# Patient Record
Sex: Male | Born: 1990
Health system: Southern US, Community
[De-identification: ages and names within clinical notes are randomized; demographics above are authoritative.]

## PROBLEM LIST (undated history)

## (undated) DIAGNOSIS — I10 Essential (primary) hypertension: Secondary | ICD-10-CM

## (undated) DIAGNOSIS — F840 Autistic disorder: Secondary | ICD-10-CM

## (undated) DIAGNOSIS — E119 Type 2 diabetes mellitus without complications: Secondary | ICD-10-CM

## (undated) DIAGNOSIS — F319 Bipolar disorder, unspecified: Secondary | ICD-10-CM

## (undated) HISTORY — PX: NO PAST SURGERIES: SHX2092

---
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
## 2012-07-03 IMAGING — CT CT ORBITS W/O CM
3 of 6 series · 15 of 47 positions shown, 18 images · non-contrast
Comparison: None.

CT HEAD

CLINICAL DATA: Blunt trauma.  Pain.  Laceration above the right
eye.

CT HEAD AND ORBITS WITHOUT CONTRAST
TECHNIQUE: Contiguous axial images were obtained from the base of
the skull through the vertex without contrast. Multidetector CT
imaging of the orbits was performed using the standard protocol
without intravenous contrast.

[Series 2: peds brain wo · axial · 0.51mm/px · z∈[+78,+208]mm · 9 of 66 slices shown, 12 images]
[im 7/66  brain]
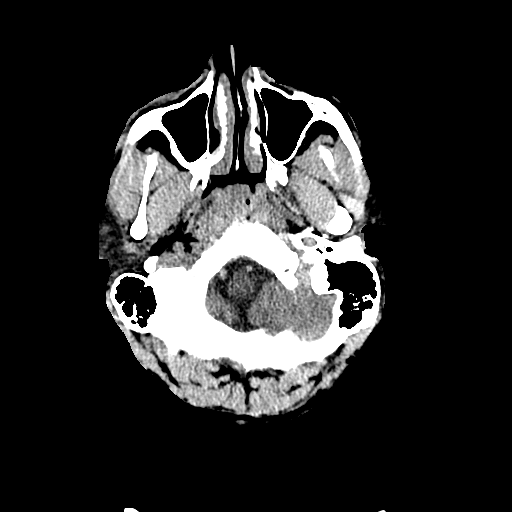
[im 7/66  bone]
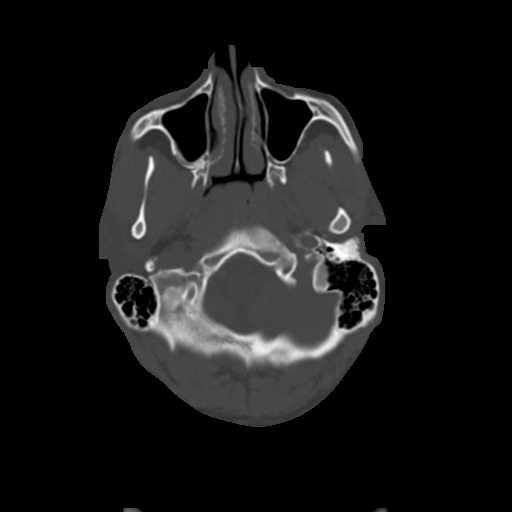
[im 14/66  bone]
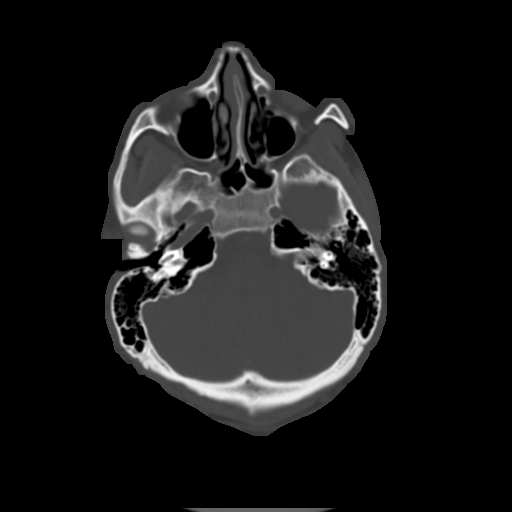
[im 20/66  bone]
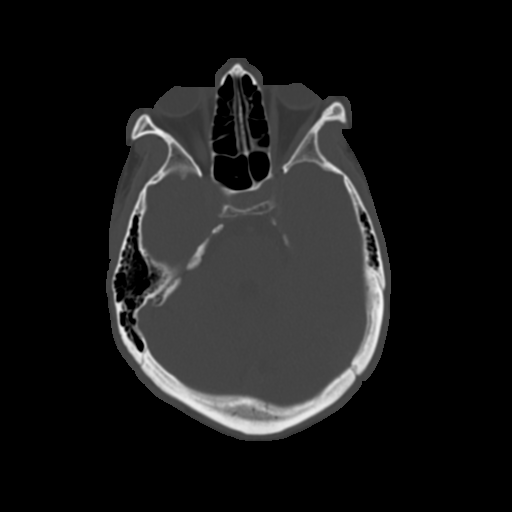
[im 27/66  bone]
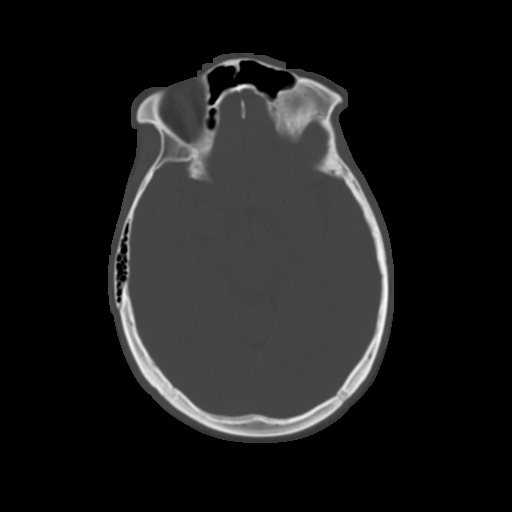
[im 33/66  brain]
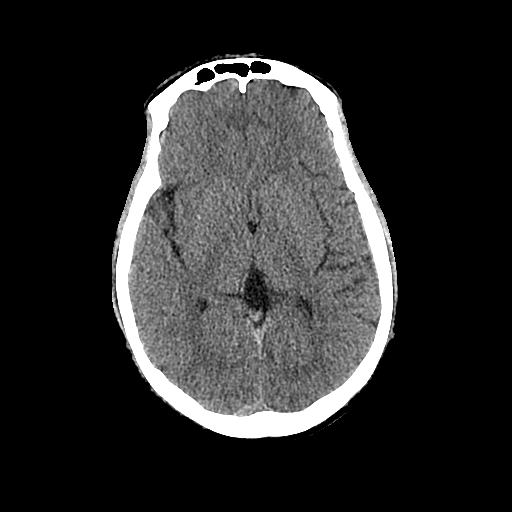
[im 33/66  bone]
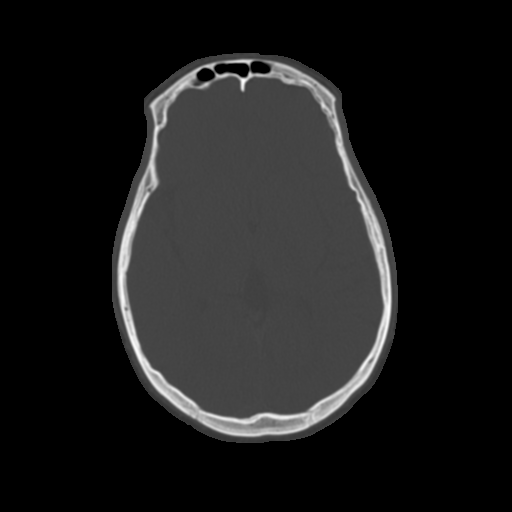
[im 40/66  bone]
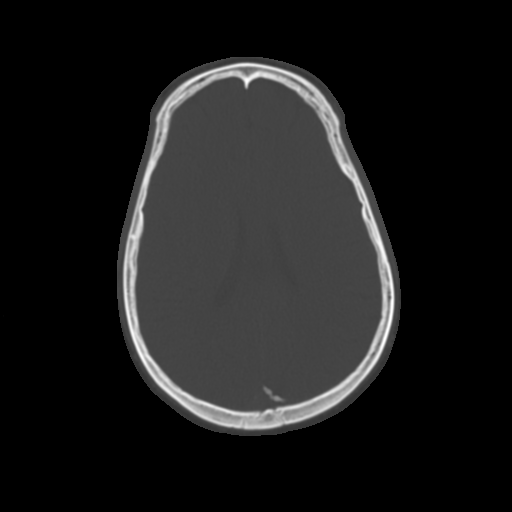
[im 46/66  bone]
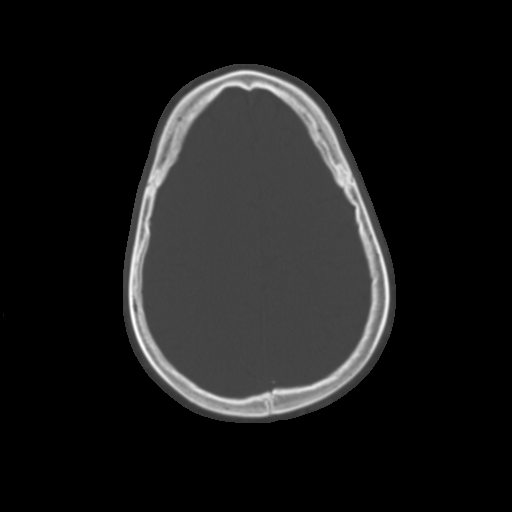
[im 53/66  bone]
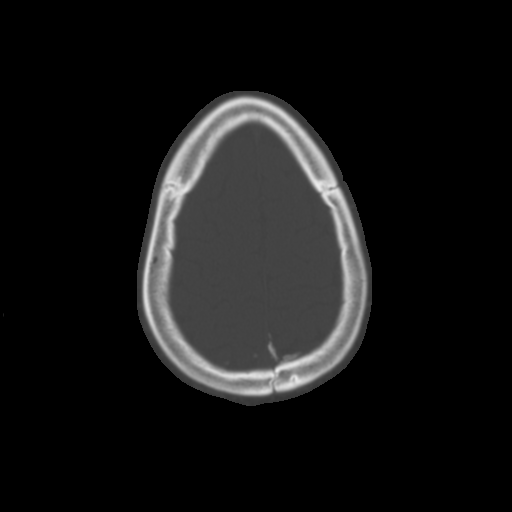
[im 59/66  brain]
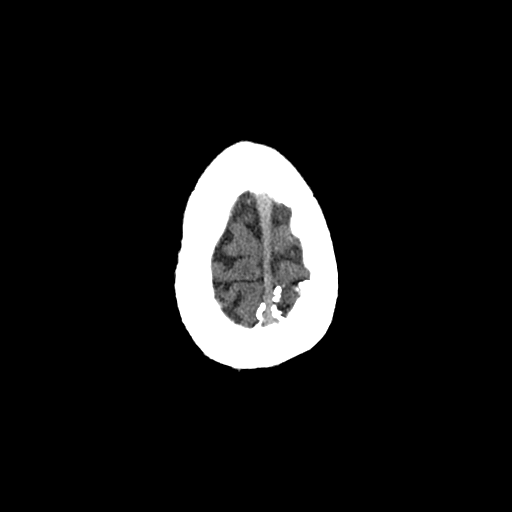
[im 59/66  bone]
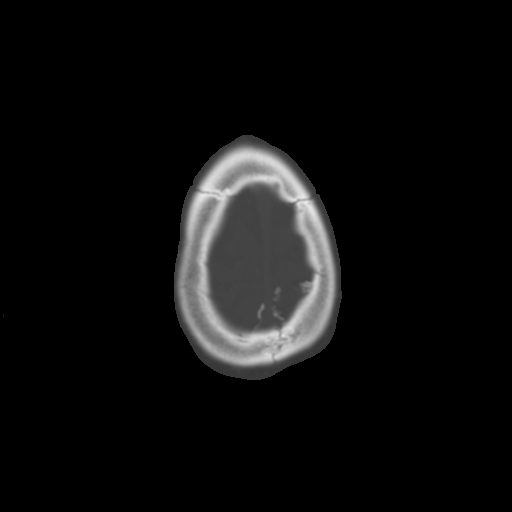

[mpr, coronal std, coronal · coronal · 0.36mm/px · 3 of 62 slices shown]
[im 21/62  bone]
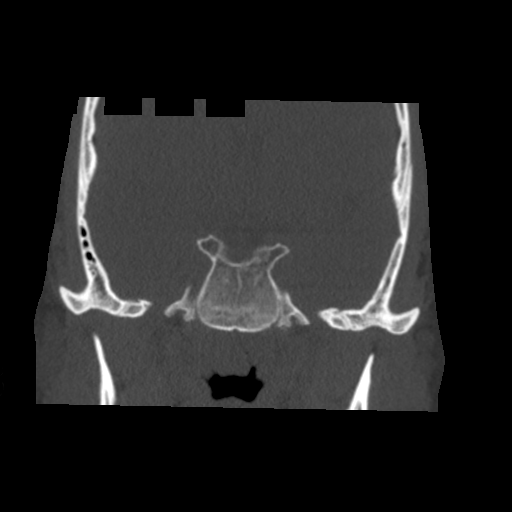
[im 28/62  bone]
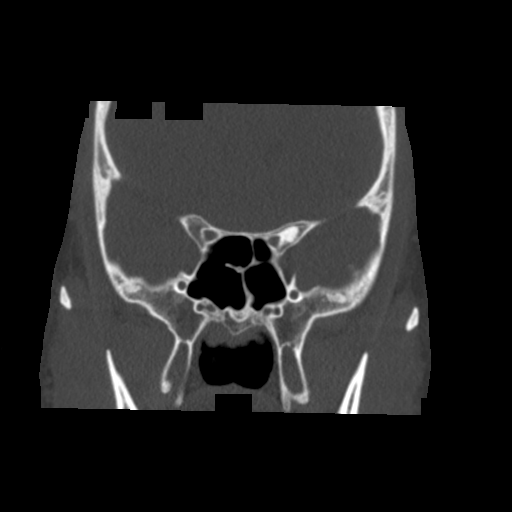
[im 34/62  bone]
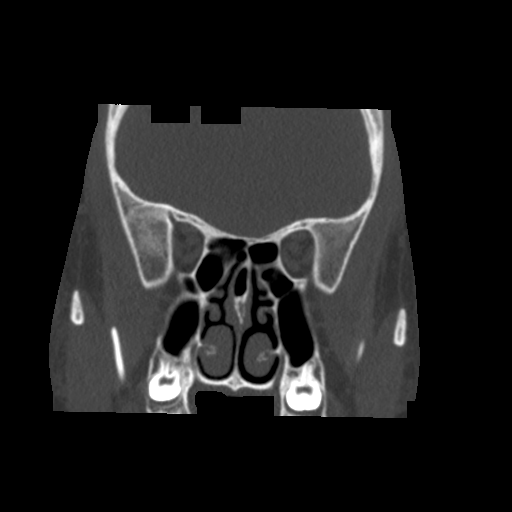

[mpr, sagittal bone, sagittal · sagittal · 0.36mm/px · 3 of 86 slices shown]
[im 2/86  bone]
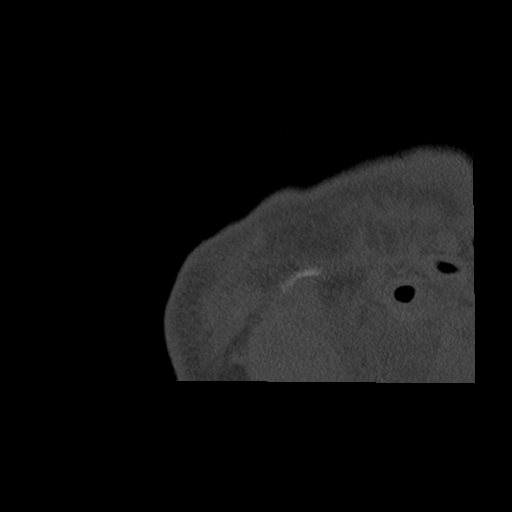
[im 29/86  bone]
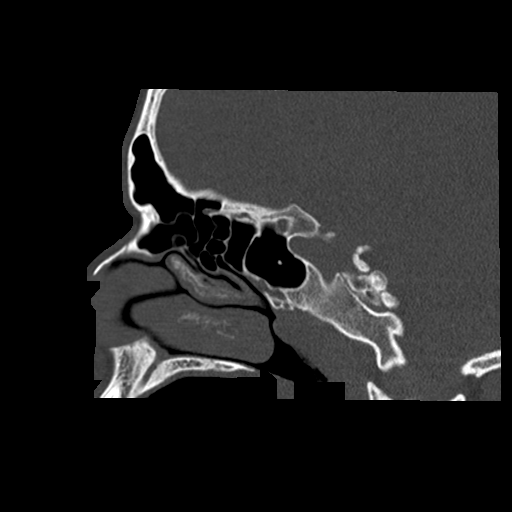
[im 57/86  bone]
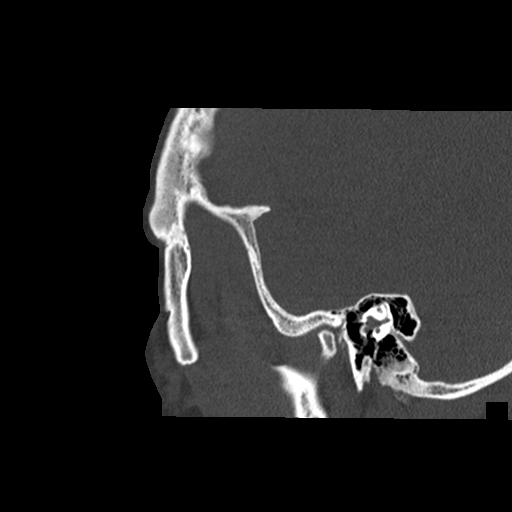

[15 of 47 positions shown; findings below may reference images not displayed]

FINDINGS: The brain appears normal without evidence of acute
infarction, hemorrhage, mass lesion, mass effect, midline shift or
abnormal extra-axial fluid collection.  There is no pneumocephalus
or hydrocephalus.  Calvarium is intact.
IMPRESSION: Negative head CT.

CT ORBITS
FINDINGS: The globes are intact and the lenses are located.  There
is some soft tissue contusion about the right eye.  No fracture.
Imaged paranasal sinuses and mastoid air cells are clear.
IMPRESSION: Soft tissue contusion about the right eye.  No fracture or other
acute abnormality.

## 2012-10-02 ENCOUNTER — Emergency Department (INDEPENDENT_AMBULATORY_CARE_PROVIDER_SITE_OTHER)
Admission: EM | Admit: 2012-10-02 | Discharge: 2012-10-02 | Disposition: A | Discharge status: Home / Self Care | Payer: Medicaid Other | Source: Home / Self Care | Attending: Family Medicine | Admitting: Family Medicine

## 2012-10-02 ENCOUNTER — Encounter (HOSPITAL_COMMUNITY): Payer: Self-pay | Admitting: Emergency Medicine

## 2012-10-02 DIAGNOSIS — Z760 Encounter for issue of repeat prescription: Secondary | ICD-10-CM

## 2012-10-02 DIAGNOSIS — J310 Chronic rhinitis: Secondary | ICD-10-CM

## 2012-10-02 MED ORDER — FLUTICASONE PROPIONATE 50 MCG/ACT NA SUSP: 2.0000 | Freq: Every day | NASAL | Status: DC

## 2012-10-02 NOTE — ED Notes (Signed)
Requested med refill on flonase.  Patient has been a patient at [facility].

## 2012-10-02 NOTE — ED Provider Notes (Signed)
History     CSN: [id number]  Arrival date & time 10/02/12  0941   First MD Initiated Contact with Patient 10/02/12 1011      Chief Complaint  Patient presents with  . Medication Refill    (Consider location/radiation/quality/duration/timing/severity/associated sxs/prior treatment) HPI Comments: 21 year old male with history of autism, developmental delay and seasonal allergies and chronic rhinitis. Here with his guardian requesting refills on his chronic nasal steroid. He was a [facility] patient that does not currently have a primary care provider. Patient is otherwise doing well. Good appetite and energy level at baseline. No new symptoms. Specifically no fever. No rhinorrhea. No cough or difficulty breathing. No nausea vomiting or diarrhea.    Past Medical History  Diagnosis Date  . Autism   . Autism spectrum     History reviewed. No pertinent past surgical history.  No family history on file.  History  Substance Use Topics  . Smoking status: Never Smoker   . Smokeless tobacco: Not on file  . Alcohol Use: No      Review of Systems  Constitutional: Negative for fever and chills.  HENT: Positive for congestion. Negative for sore throat, rhinorrhea and trouble swallowing.   Respiratory: Negative for cough, shortness of breath and wheezing.   Gastrointestinal: Negative for abdominal pain.  Neurological: Negative for headaches.    Allergies  Review of patient's allergies indicates no known allergies.  Home Medications   Current Outpatient Rx  Name Route Sig Dispense Refill  . DIVALPROEX SODIUM 250 MG PO TBEC Oral Take 250 mg by mouth 2 (two) times daily. Takes 2 tabs in morning andTakes 2 tabs at bedtime    . FLUTICASONE PROPIONATE 50 MCG/ACT NA SUSP Nasal Place 2 sprays into the nose daily. 2 sprays to each nostril at bedtime. 16 g 2  . GUAIFENESIN ER 600 MG PO TB12 Oral Take 2 tablets (1,200 mg total) by mouth 2 (two) times daily. 30 tablet 1  . GUANFACINE  HCL 1 MG PO TABS Oral Take 1 mg by mouth 2 (two) times daily.      . IPRATROPIUM BROMIDE 0.06 % NA SOLN Nasal Place 2 sprays into the nose 4 (four) times daily. 15 mL 1  . LORAZEPAM 1 MG PO TABS Oral Take 1 mg by mouth every 8 (eight) hours. Two tabs prn for outburst.     . RISPERIDONE 1 MG PO TABS Oral Take 1 mg by mouth 2 (two) times daily. Two pills in the am, Two pills in the pm       BP 151/85  Pulse 98  Temp 96.6 F (35.9 C) (Oral)  Resp 24  SpO2 99%  Physical Exam  Nursing note and vitals reviewed. Constitutional: He appears well-nourished. No distress.       Cooperative  HENT:       Patient keeps his mouth open all the time and has a mouth breathing. Nasal Congestion with erythema and swelling of nasal turbinates, no rhinorrhea. no pharyngeal erythema no exudates. No uvula deviation. No trismus. Partial wax buildup in left ear canal .TM's appear normal.  Cardiovascular: Normal rate and regular rhythm.   Pulmonary/Chest: Effort normal and breath sounds normal.  Lymphadenopathy:    He has no cervical adenopathy.  Neurological: He is alert.  Skin: No rash noted.    ED Course  Procedures (including critical care time)  Labs Reviewed - No data to display No results found.   1. Medication refill  MDM  Flonase refilled. Patient had flu vaccine already. Has guardian to find a new primary care provider in our area patient does has Medicaid.        Sharin Grave, MD 10/02/12 1627

## 2012-12-30 ENCOUNTER — Emergency Department (INDEPENDENT_AMBULATORY_CARE_PROVIDER_SITE_OTHER)
Admission: EM | Admit: 2012-12-30 | Discharge: 2012-12-30 | Disposition: A | Discharge status: Home / Self Care | Payer: Medicaid Other | Source: Home / Self Care

## 2012-12-30 ENCOUNTER — Encounter (HOSPITAL_COMMUNITY): Payer: Self-pay

## 2012-12-30 DIAGNOSIS — F840 Autistic disorder: Secondary | ICD-10-CM

## 2012-12-30 DIAGNOSIS — I10 Essential (primary) hypertension: Secondary | ICD-10-CM

## 2012-12-30 LAB — CBC WITH DIFFERENTIAL/PLATELET
Basophils Absolute: 0 10*3/uL (ref 0.0–0.1)
Eosinophils Absolute: 0.1 10*3/uL (ref 0.0–0.7)
Lymphocytes Relative: 36 % (ref 12–46)
Lymphs Abs: 2.2 10*3/uL (ref 0.7–4.0)
MCH: 27.1 pg (ref 26.0–34.0)
Neutrophils Relative %: 49 % (ref 43–77)
Platelets: 96 10*3/uL — ABNORMAL LOW (ref 150–400)
RBC: 6.12 MIL/uL — ABNORMAL HIGH (ref 4.22–5.81)
WBC: 6 10*3/uL (ref 4.0–10.5)

## 2012-12-30 LAB — COMPREHENSIVE METABOLIC PANEL
ALT: 44 U/L (ref 0–53)
AST: 25 U/L (ref 0–37)
Alkaline Phosphatase: 134 U/L — ABNORMAL HIGH (ref 39–117)
GFR calc Af Amer: >90 mL/min (ref >90)
Glucose, Bld: 88 mg/dL (ref 70–99)
Potassium: 3.8 mEq/L (ref 3.5–5.1)
Sodium: 140 mEq/L (ref 135–145)
Total Protein: 8.4 g/dL — ABNORMAL HIGH (ref 6.0–8.3)

## 2012-12-30 LAB — HEMOGLOBIN A1C
Hgb A1c MFr Bld: 6.5 % — ABNORMAL HIGH (ref <5.7)
Mean Plasma Glucose: 140 mg/dL — ABNORMAL HIGH (ref <117)

## 2012-12-30 LAB — LIPID PANEL
LDL Cholesterol: 133 mg/dL — ABNORMAL HIGH (ref 0–99)
Total CHOL/HDL Ratio: 4.7 RATIO
VLDL: 22 mg/dL (ref 0–40)

## 2012-12-30 NOTE — ED Notes (Signed)
Here with legal guardian- has history of chronic encephalopathy, autism, htn

## 2012-12-30 NOTE — ED Provider Notes (Signed)
History     CSN: [id number]  Arrival date & time 12/30/12  1115   Chief Complaint  Patient presents with  . Agitation   HPI Pt presents today for some labwork requested by his developmental psychiatrist.   He needs to have CMP, lipid, A1c, depakote level drawn,   Pt has been doing well.  He has been going to [facility] for most of his medical care.  He was given some lorazepam this morning and he is fasting.  The patient's family reports he is doing very well.  No problems to report.   Past Medical History  Diagnosis Date  . Autism   . Autism spectrum     History reviewed. No pertinent past surgical history.  No family history on file.  History  Substance Use Topics  . Smoking status: Never Smoker   . Smokeless tobacco: Not on file  . Alcohol Use: No    Review of Systems  Psychiatric/Behavioral: Positive for behavioral problems and agitation.  All other systems reviewed and are negative.    Allergies  Review of patient's allergies indicates no known allergies.  Home Medications   Current Outpatient Rx  Name  Route  Sig  Dispense  Refill  . CETIRIZINE HCL 10 MG PO TABS   Oral   Take 10 mg by mouth daily.         . TRAZODONE HCL 50 MG PO TABS   Oral   Take 50 mg by mouth at bedtime.         Marland Kitchen DIVALPROEX SODIUM 250 MG PO TBEC   Oral   Take 250 mg by mouth 2 (two) times daily. Takes 2 tabs in morning andTakes 2 tabs at bedtime         . FLUTICASONE PROPIONATE 50 MCG/ACT NA SUSP   Nasal   Place 2 sprays into the nose daily. 2 sprays to each nostril at bedtime.   16 g   2   . GUANFACINE HCL 1 MG PO TABS   Oral   Take 1 mg by mouth 2 (two) times daily.           . IPRATROPIUM BROMIDE 0.06 % NA SOLN   Nasal   Place 2 sprays into the nose 4 (four) times daily.   15 mL   1   . LORAZEPAM 1 MG PO TABS   Oral   Take 1 mg by mouth every 8 (eight) hours. Two tabs prn for outburst.          . RISPERIDONE 1 MG PO TABS   Oral   Take 1 mg by mouth 2  (two) times daily. Two pills in the am, Two pills in the pm            BP 124/64  Pulse 100  Temp 97.6 F (36.4 C) (Oral)  Resp 17  SpO2 100%  Physical Exam  Nursing note and vitals reviewed. Constitutional: He is oriented to person, place, and time. He appears well-developed and well-nourished. No distress.  HENT:  Head: Normocephalic and atraumatic.  Eyes: EOM are normal. Pupils are equal, round, and reactive to light.  Neck: Normal range of motion. Neck supple.  Cardiovascular: Normal rate, regular rhythm and normal heart sounds.   Pulmonary/Chest: Effort normal.  Abdominal: Soft. Bowel sounds are normal.  Musculoskeletal: Normal range of motion. He exhibits no edema and no tenderness.  Neurological: He is alert and oriented to person, place, and time.  Skin: Skin is warm and  dry.    ED Course  Procedures (including critical care time)  Labs Reviewed - No data to display No results found.   No diagnosis found.   MDM  IMPRESSION  Autistic Disorder  Chronic Encephalopathy  Sensory Integration Disorder  HTN  Chronic Constipation  Xerosis  Allergic Rhinitis  RECOMMENDATIONS / PLAN Ordered labs as requested.   CMP, a1c, Vit D, lipid profile, depakote level, cbc differential Fax Lab Results to [phone]  FOLLOW UP Every 6 months   The patient was given clear instructions to go to ER or return to medical center if symptoms don't improve, worsen or new problems develop.  The patient verbalized understanding.  The patient was told to call to get lab results if they haven't heard anything in the next week.            Cleora Fleet, MD 12/30/12 1929

## 2012-12-31 ENCOUNTER — Telehealth (HOSPITAL_COMMUNITY): Payer: Self-pay

## 2012-12-31 NOTE — Telephone Encounter (Signed)
Message copied by Lestine Mount on Tue Dec 31, 2012  6:31 PM ------      Message from: Cleora Fleet      Created: Tue Dec 31, 2012 10:15 AM       Please fax copy of labs to patient's behavioral specialist.  The labs were reviewed and it looks like the patient has developed type 2 diabetes mellitus.  I'm not sure if this has been diagnosed in the past but I didn't see it in any of the records that they brought yesterday.  Discussed this with the patient's caretakers.  I'm recommending that he start a low carbohydrate diet.  No concentrated sweets or fruit juices or sodas.  Recheck labs in 3 months.  He may need to start medications in the future if diet doesn't help with improving his blood sugars.                    Rodney Langton, MD, CDE, FAAFP      [facility]      [facility]      [place], [place]

## 2012-12-31 NOTE — Progress Notes (Signed)
Quick Note:  Please fax copy of labs to patient's behavioral specialist. The labs were reviewed and it looks like the patient has developed type 2 diabetes mellitus. I'm not sure if this has been diagnosed in the past but I didn't see it in any of the records that they brought yesterday. Discussed this with the patient's caretakers. I'm recommending that he start a low carbohydrate diet. No concentrated sweets or fruit juices or sodas. Recheck labs in 3 months. He may need to start medications in the future if diet doesn't help with improving his blood sugars.    Rodney Langton, MD, CDE, FAAFP [facility] [place], [place]   ______

## 2013-03-20 ENCOUNTER — Ambulatory Visit (HOSPITAL_COMMUNITY)
Admission: RE | Admit: 2013-03-20 | Discharge: 2013-03-20 | Disposition: A | Discharge status: Home / Self Care | Payer: Medicaid Other | Source: Ambulatory Visit | Attending: Family Medicine | Admitting: Family Medicine

## 2013-03-20 ENCOUNTER — Other Ambulatory Visit (HOSPITAL_COMMUNITY): Payer: Self-pay | Admitting: Family Medicine

## 2013-03-20 DIAGNOSIS — R109 Unspecified abdominal pain: Secondary | ICD-10-CM | POA: Insufficient documentation

## 2013-03-20 DIAGNOSIS — R52 Pain, unspecified: Secondary | ICD-10-CM

## 2013-03-20 DIAGNOSIS — K5900 Constipation, unspecified: Secondary | ICD-10-CM | POA: Insufficient documentation

## 2014-05-20 ENCOUNTER — Encounter (HOSPITAL_COMMUNITY): Payer: Self-pay | Admitting: Pharmacist

## 2014-05-20 NOTE — Pre-Procedure Instructions (Signed)
Jonathan Bird  05/20/2014   Your procedure is scheduled on:  Monday May 25, 2014 at 8:00 AM.  Report to [facility] Admitting at 5:30 AM.  Call this number if you have problems the morning of surgery: [phone]   Remember:   Do not eat food or drink liquids after midnight.   Take these medicines the morning of surgery with A SIP OF WATER: Cetirizine (Zyrtec), Clonazepam (Klonopin) if needed, Divalproex (Depakote), Guanfacine (Tenex), Atrovent nasal spray, Lorazepam (Ativan) if needed, Paliperidone (Invega)   Do not wear jewelry.  Do not wear lotions, powders, or cologne.   Men may shave face and neck.  Do not bring valuables to the hospital.  [facility] is not responsible for any belongings or valuables.               Contacts, dentures or bridgework may not be worn into surgery.  Leave suitcase in the car. After surgery it may be brought to your room.  For patients admitted to the hospital, discharge time is determined by your treatment team.               Patients discharged the day of surgery will not be allowed to drive home.  Name and phone number of your driver: Family/Friend  Special Instructions: Shower using CHG soap the night before and the morning of your surgery   Please read over the following fact sheets that you were given: Pain Booklet, Coughing and Deep Breathing and Surgical Site Infection Prevention

## 2014-05-21 ENCOUNTER — Encounter (HOSPITAL_COMMUNITY)
Admission: RE | Admit: 2014-05-21 | Discharge: 2014-05-21 | Disposition: A | Discharge status: Home / Self Care | Payer: Medicaid Other | Source: Ambulatory Visit | Attending: Oral Surgery | Admitting: Oral Surgery

## 2014-05-21 ENCOUNTER — Encounter (HOSPITAL_COMMUNITY): Payer: Self-pay

## 2014-05-21 DIAGNOSIS — Z01810 Encounter for preprocedural cardiovascular examination: Secondary | ICD-10-CM | POA: Insufficient documentation

## 2014-05-21 DIAGNOSIS — Z01812 Encounter for preprocedural laboratory examination: Secondary | ICD-10-CM | POA: Insufficient documentation

## 2014-05-21 HISTORY — DX: Essential (primary) hypertension: I10

## 2014-05-21 HISTORY — DX: Type 2 diabetes mellitus without complications: E11.9

## 2014-05-21 HISTORY — DX: Bipolar disorder, unspecified: F31.9

## 2014-05-21 LAB — BASIC METABOLIC PANEL
BUN: 12 mg/dL (ref 6–23)
CO2: 25 mEq/L (ref 19–32)
Calcium: 9.9 mg/dL (ref 8.4–10.5)
Chloride: 103 mEq/L (ref 96–112)
Creatinine, Ser: 0.88 mg/dL (ref 0.50–1.35)
Glucose, Bld: 112 mg/dL — ABNORMAL HIGH (ref 70–99)
POTASSIUM: 4 meq/L (ref 3.7–5.3)
SODIUM: 142 meq/L (ref 137–147)

## 2014-05-21 LAB — CBC
HEMATOCRIT: 44.1 % (ref 39.0–52.0)
Hemoglobin: 14.7 g/dL (ref 13.0–17.0)
MCH: 27.2 pg (ref 26.0–34.0)
MCHC: 33.3 g/dL (ref 30.0–36.0)
MCV: 81.7 fL (ref 78.0–100.0)
Platelets: 152 10*3/uL (ref 150–400)
RBC: 5.4 MIL/uL (ref 4.22–5.81)
RDW: 14.2 % (ref 11.5–15.5)
WBC: 7.8 10*3/uL (ref 4.0–10.5)

## 2014-05-21 NOTE — H&P (Signed)
HISTORY AND PHYSICAL  Jonathan Bird is a 23 y.o. male patient with CC: referred by pediatric dentist for removal impacted third molas  No diagnosis found.  Past Medical History  Diagnosis Date  . Autism     outbursts  . Autism spectrum   . Hypertension   . Bipolar disorder   . Diabetes mellitus without complication     boarderline    No current facility-administered medications for this encounter.   Current Outpatient Prescriptions  Medication Sig Dispense Refill  . cetirizine (ZYRTEC) 10 MG tablet Take 10 mg by mouth daily.      . clonazePAM (KLONOPIN) 0.5 MG disintegrating tablet Take 1 mg by mouth daily as needed. for outburst      . divalproex (DEPAKOTE) 250 MG DR tablet Take 250 mg by mouth 2 (two) times daily. Takes 1 tabs in morning and takes 2 tabs at bedtime      . fluticasone (FLONASE) 50 MCG/ACT nasal spray Place 2 sprays into the nose daily. 2 sprays to each nostril at bedtime.  16 g  2  . guanFACINE (TENEX) 1 MG tablet Take 1 mg by mouth 2 (two) times daily.        Marland Kitchen LORazepam (ATIVAN) 1 MG tablet Take 3 mg by mouth daily as needed for anxiety (outburst).       . paliperidone (INVEGA) 3 MG 24 hr tablet Take 3 mg by mouth daily.      . polyethylene glycol (MIRALAX / GLYCOLAX) packet Take 17 g by mouth daily.      . traZODone (DESYREL) 100 MG tablet Take 100 mg by mouth at bedtime.      Marland Kitchen ipratropium (ATROVENT) 0.06 % nasal spray Place 2 sprays into the nose 4 (four) times daily.  15 mL  1  . paliperidone (INVEGA) 6 MG 24 hr tablet Take 6 mg by mouth at bedtime.       No Known Allergies Active Problems:   * No active hospital problems. *  Vitals: There were no vitals taken for this visit. Lab results: Results for orders placed during the hospital encounter of 05/21/14 (from the past 24 hour(s))  CBC     Status: None   Collection Time    05/21/14  2:01 PM      Result Value Ref Range   WBC 7.8  4.0 - 10.5 K/uL   RBC 5.40  4.22 - 5.81 MIL/uL   Hemoglobin 14.7   13.0 - 17.0 g/dL   HCT 99.3  57.0 - 17.7 %   MCV 81.7  78.0 - 100.0 fL   MCH 27.2  26.0 - 34.0 pg   MCHC 33.3  30.0 - 36.0 g/dL   RDW 93.9  03.0 - 09.2 %   Platelets 152  150 - 400 K/uL   Radiology Results: No results found. General appearance: moderately obese and slowed mentation Head: Normocephalic, without obvious abnormality, atraumatic Eyes: negative Nose: Nares normal. Septum midline. Mucosa normal. No drainage or sinus tenderness. Throat: pharynx clear. no lesions. Impacted teeth # 1, 16, 17, 32 Neck: no adenopathy, supple, symmetrical, trachea midline and thyroid not enlarged, symmetric, no tenderness/mass/nodules Resp: clear to auscultation bilaterally Cardio: regular rate and rhythm, S1, S2 normal, no murmur, click, rub or gallop  Assessment: impacted teeth 1, 16, 17, 32  Plan: Extraction teeth # 1, 16, 17, 32. General anesthesia. Day surgery.   Georgia Lopes 05/21/2014

## 2014-05-21 NOTE — Progress Notes (Signed)
Jonathan Bird (legal guardian) was at chairside during PAT visit and informed Nurse that patients PCP is Jonathan Bird in [place]. Patient appeared to have several "outburst" during PAT visit, but began to calm down when corrected by Jonathan Bird and towards the end of PAT visit. Jonathan Bird, [place] consulted and came to see patient and guardian. EKG obtained however Jonathan Bird, [place] stated that a chest xray would not be needed at this time. Jonathan Bird denied patient having any cardiac history other than hypertension or pulmonary issues. Patient had a sleep study last year but does not have sleep apnea per Jonathan Bird. When asked about past surgical history Jonathan Bird stated "the only time Jonathan Bird had been put to sleep was when they were going to clean out his ears". At end of the PAT visit Jonathan Bird gave Nurse a thumbs up and skipped away down the hall.

## 2014-05-21 NOTE — Progress Notes (Addendum)
Anesthesia PAT Evaluation:  Patient is a 23 year old male scheduled for wisdom teeth extraction on 05/25/14 by Dr. Barbette Merino.   History includes HTN, borderline DM (has not had to check home glucose readings), autism, Bipolar disorder, non-smoker, exam under anesthesia of left ear cerumen impaction '06. He sees a Leisure centre manager, Dr. Lamar Sprinkles at [facility].  His previous PCP relocated, so he is suppose to get established with a PCP at a local urgent care in early July.  Today Prathik is here with his guardian Tilda Burrow.  Ovila has lived with Mr. Maricela Curet for ten years.  There is no known cardiac or respiratory issues, no asthma, or frequent URI. No seizures. No OSA.     On exam, he makes occasional loud outbursts, but was overall cooperative.  His guardian had to answer most of the questions.  Heart RRR, no murmur noted. Lungs clear without wheezes or rhonchi. No LE edema.  Good mouth opening.   EKG on 05/21/14 showed NSR.  His respiratory exam was unremarkable.  O2 sat 98%. He does not smoke.  I do not think that he will require a preoperative CXR.  Preoperative labs noted.  Glucose was 112.  I told Mr. Maricela Curet that if patient cannot take meds with only a few sips of water, than to hold them on the morning of surgery; however, he feels that patient will not have any problems.  Mr. Maricela Curet did report that patient will become agitated if he has to wait in the lobby on arrival, so he should be quickly escorted to a patient room where he would be more calm.  Since he is currently a first case, I think that he will be taken to [facility] rather quickly following his arrival.    Velna Ochs  [facility] Short Stay Center/Anesthesiology Phone [phone] 05/21/2014 3:17 PM

## 2014-05-24 MED ORDER — CEFAZOLIN SODIUM-DEXTROSE 2-3 GM-% IV SOLR
2.0000 g | INTRAVENOUS | Status: AC
  Administered 2014-05-25: 2 g via INTRAVENOUS
  Filled 2014-05-24: qty 50

## 2014-05-25 ENCOUNTER — Ambulatory Visit (HOSPITAL_COMMUNITY): Payer: Medicaid Other | Admitting: Anesthesiology

## 2014-05-25 ENCOUNTER — Encounter (HOSPITAL_COMMUNITY): Payer: Medicaid Other | Admitting: Vascular Surgery

## 2014-05-25 ENCOUNTER — Encounter (HOSPITAL_COMMUNITY)
Admission: RE | Disposition: A | Discharge status: Home / Self Care | Payer: Self-pay | Source: Ambulatory Visit | Attending: Oral Surgery

## 2014-05-25 ENCOUNTER — Encounter (HOSPITAL_COMMUNITY): Payer: Self-pay | Admitting: *Deleted

## 2014-05-25 ENCOUNTER — Ambulatory Visit (HOSPITAL_COMMUNITY)
Admission: RE | Admit: 2014-05-25 | Discharge: 2014-05-25 | Disposition: A | Discharge status: Home / Self Care | Payer: Medicaid Other | Source: Ambulatory Visit | Attending: Oral Surgery | Admitting: Oral Surgery

## 2014-05-25 DIAGNOSIS — Z79899 Other long term (current) drug therapy: Secondary | ICD-10-CM | POA: Insufficient documentation

## 2014-05-25 DIAGNOSIS — K006 Disturbances in tooth eruption: Secondary | ICD-10-CM | POA: Insufficient documentation

## 2014-05-25 DIAGNOSIS — E669 Obesity, unspecified: Secondary | ICD-10-CM | POA: Insufficient documentation

## 2014-05-25 DIAGNOSIS — E119 Type 2 diabetes mellitus without complications: Secondary | ICD-10-CM | POA: Insufficient documentation

## 2014-05-25 DIAGNOSIS — K011 Impacted teeth: Secondary | ICD-10-CM

## 2014-05-25 DIAGNOSIS — F319 Bipolar disorder, unspecified: Secondary | ICD-10-CM | POA: Insufficient documentation

## 2014-05-25 DIAGNOSIS — I10 Essential (primary) hypertension: Secondary | ICD-10-CM | POA: Insufficient documentation

## 2014-05-25 DIAGNOSIS — F840 Autistic disorder: Secondary | ICD-10-CM | POA: Insufficient documentation

## 2014-05-25 HISTORY — PX: TOOTH EXTRACTION: SHX859

## 2014-05-25 LAB — GLUCOSE, CAPILLARY: GLUCOSE-CAPILLARY: 109 mg/dL — AB (ref 70–99)

## 2014-05-25 SURGERY — EXTRACTION, TOOTH, MOLAR
Anesthesia: General | Site: Mouth

## 2014-05-25 MED ORDER — PROMETHAZINE HCL 25 MG/ML IJ SOLN: 6.2500 mg | INTRAMUSCULAR | Status: DC | PRN

## 2014-05-25 MED ORDER — LACTATED RINGERS IV SOLN
INTRAVENOUS | Status: DC | PRN
  Administered 2014-05-25: 07:00:00 via INTRAVENOUS

## 2014-05-25 MED ORDER — OXYMETAZOLINE HCL 0.05 % NA SOLN
NASAL | Status: AC
  Filled 2014-05-25: qty 15

## 2014-05-25 MED ORDER — MIDAZOLAM HCL 2 MG/2ML IJ SOLN: 0.5000 mg | Freq: Once | INTRAMUSCULAR | Status: DC | PRN

## 2014-05-25 MED ORDER — LIDOCAINE-EPINEPHRINE 2 %-1:100000 IJ SOLN
INTRAMUSCULAR | Status: AC
  Filled 2014-05-25: qty 1

## 2014-05-25 MED ORDER — MIDAZOLAM HCL 5 MG/5ML IJ SOLN
INTRAMUSCULAR | Status: DC | PRN
  Administered 2014-05-25: 1 mg via INTRAVENOUS

## 2014-05-25 MED ORDER — MEPERIDINE HCL 25 MG/ML IJ SOLN: 6.2500 mg | INTRAMUSCULAR | Status: DC | PRN

## 2014-05-25 MED ORDER — SODIUM CHLORIDE 0.9 % IR SOLN
Status: DC | PRN
  Administered 2014-05-25: 1000 mL

## 2014-05-25 MED ORDER — PROPOFOL 10 MG/ML IV BOLUS
INTRAVENOUS | Status: AC
  Filled 2014-05-25: qty 20

## 2014-05-25 MED ORDER — OXYMETAZOLINE HCL 0.05 % NA SOLN
NASAL | Status: DC | PRN
  Administered 2014-05-25: 2 via NASAL

## 2014-05-25 MED ORDER — LABETALOL HCL 5 MG/ML IV SOLN
INTRAVENOUS | Status: DC | PRN
  Administered 2014-05-25: 10 mg via INTRAVENOUS

## 2014-05-25 MED ORDER — LIDOCAINE-EPINEPHRINE 2 %-1:100000 IJ SOLN
INTRAMUSCULAR | Status: DC | PRN
  Administered 2014-05-25: 20 mL via INTRADERMAL

## 2014-05-25 MED ORDER — OXYCODONE-ACETAMINOPHEN 5-325 MG PO TABS: 2.0000 | ORAL_TABLET | ORAL | Status: DC | PRN

## 2014-05-25 MED ORDER — OXYCODONE HCL 5 MG PO TABS: 5.0000 mg | ORAL_TABLET | Freq: Once | ORAL | Status: DC | PRN

## 2014-05-25 MED ORDER — FENTANYL CITRATE 0.05 MG/ML IJ SOLN
INTRAMUSCULAR | Status: AC
  Filled 2014-05-25: qty 5

## 2014-05-25 MED ORDER — SUCCINYLCHOLINE CHLORIDE 20 MG/ML IJ SOLN
INTRAMUSCULAR | Status: DC | PRN
  Administered 2014-05-25: 140 mg via INTRAVENOUS

## 2014-05-25 MED ORDER — MIDAZOLAM HCL 2 MG/2ML IJ SOLN
INTRAMUSCULAR | Status: AC
  Filled 2014-05-25: qty 2

## 2014-05-25 MED ORDER — ONDANSETRON HCL 4 MG/2ML IJ SOLN
INTRAMUSCULAR | Status: AC
  Filled 2014-05-25: qty 2

## 2014-05-25 MED ORDER — OXYCODONE HCL 5 MG/5ML PO SOLN: 5.0000 mg | Freq: Once | ORAL | Status: DC | PRN

## 2014-05-25 MED ORDER — LIDOCAINE HCL (CARDIAC) 20 MG/ML IV SOLN
INTRAVENOUS | Status: AC
  Filled 2014-05-25: qty 5

## 2014-05-25 MED ORDER — HYDROMORPHONE HCL PF 1 MG/ML IJ SOLN: 0.2500 mg | INTRAMUSCULAR | Status: DC | PRN

## 2014-05-25 MED ORDER — SUCCINYLCHOLINE CHLORIDE 20 MG/ML IJ SOLN
INTRAMUSCULAR | Status: AC
  Filled 2014-05-25: qty 1

## 2014-05-25 MED ORDER — ARTIFICIAL TEARS OP OINT
TOPICAL_OINTMENT | OPHTHALMIC | Status: DC | PRN
  Administered 2014-05-25: 1 via OPHTHALMIC

## 2014-05-25 MED ORDER — FENTANYL CITRATE 0.05 MG/ML IJ SOLN
INTRAMUSCULAR | Status: DC | PRN
  Administered 2014-05-25: 100 ug via INTRAVENOUS

## 2014-05-25 MED ORDER — PROPOFOL 10 MG/ML IV BOLUS
INTRAVENOUS | Status: DC | PRN
  Administered 2014-05-25: 50 mg via INTRAVENOUS
  Administered 2014-05-25: 300 mg via INTRAVENOUS
  Administered 2014-05-25: 50 mg via INTRAVENOUS

## 2014-05-25 MED ORDER — LABETALOL HCL 5 MG/ML IV SOLN
INTRAVENOUS | Status: AC
  Filled 2014-05-25: qty 4

## 2014-05-25 MED ORDER — ROCURONIUM BROMIDE 50 MG/5ML IV SOLN
INTRAVENOUS | Status: AC
  Filled 2014-05-25: qty 1

## 2014-05-25 SURGICAL SUPPLY — 33 items
BLADE 10 SAFETY STRL DISP (BLADE) ×3 IMPLANT
BUR CROSS CUT (BURR)
BUR CROSS CUT FISSURE 1.6 (BURR) ×2 IMPLANT
BUR CROSS CUT FISSURE 1.6MM (BURR) ×1
BUR SRG MED 1.6XXCUT FSSR (BURR) IMPLANT
BURR SRG MED 1.6XXCUT FSSR (BURR)
CANISTER SUCTION 2500CC (MISCELLANEOUS) ×3 IMPLANT
COVER SURGICAL LIGHT HANDLE (MISCELLANEOUS) ×3 IMPLANT
GAUZE PACKING FOLDED 2  STR (GAUZE/BANDAGES/DRESSINGS) ×2
GAUZE PACKING FOLDED 2 STR (GAUZE/BANDAGES/DRESSINGS) ×1 IMPLANT
GAUZE SPONGE 4X4 16PLY XRAY LF (GAUZE/BANDAGES/DRESSINGS) ×3 IMPLANT
GLOVE BIO SURGEON STRL SZ 6.5 (GLOVE) ×2 IMPLANT
GLOVE BIO SURGEON STRL SZ7.5 (GLOVE) ×3 IMPLANT
GLOVE BIO SURGEONS STRL SZ 6.5 (GLOVE) ×1
GLOVE BIOGEL PI IND STRL 7.0 (GLOVE) ×1 IMPLANT
GLOVE BIOGEL PI INDICATOR 7.0 (GLOVE) ×2
GOWN STRL REUS W/ TWL LRG LVL3 (GOWN DISPOSABLE) ×1 IMPLANT
GOWN STRL REUS W/ TWL XL LVL3 (GOWN DISPOSABLE) ×1 IMPLANT
GOWN STRL REUS W/TWL LRG LVL3 (GOWN DISPOSABLE) ×2
GOWN STRL REUS W/TWL XL LVL3 (GOWN DISPOSABLE) ×2
KIT BASIN OR (CUSTOM PROCEDURE TRAY) ×3 IMPLANT
KIT ROOM TURNOVER OR (KITS) ×3 IMPLANT
NEEDLE 22X1 1/2 (OR ONLY) (NEEDLE) ×3 IMPLANT
NS IRRIG 1000ML POUR BTL (IV SOLUTION) ×3 IMPLANT
PAD ARMBOARD 7.5X6 YLW CONV (MISCELLANEOUS) ×6 IMPLANT
SUT CHROMIC 3 0 PS 2 (SUTURE) ×6 IMPLANT
SYR CONTROL 10ML LL (SYRINGE) ×3 IMPLANT
TOWEL OR 17X26 10 PK STRL BLUE (TOWEL DISPOSABLE) ×3 IMPLANT
TRAY ENT MC OR (CUSTOM PROCEDURE TRAY) ×3 IMPLANT
TUBE CONNECTING 12'X1/4 (SUCTIONS)
TUBE CONNECTING 12X1/4 (SUCTIONS) IMPLANT
TUBING IRRIGATION (MISCELLANEOUS) IMPLANT
YANKAUER SUCT BULB TIP NO VENT (SUCTIONS) ×3 IMPLANT

## 2014-05-25 NOTE — Op Note (Signed)
NAME:  Jonathan Bird, Jonathan Bird                ACCOUNT NO.:  [id number]  MEDICAL RECORD NO.:  [id number]  LOCATION:  [facility]                         FACILITY:  [facility]  PHYSICIAN:  Georgia LopesScott M. Suriya Kovarik, M.D.  DATE OF BIRTH:  Nov 22, 1991  DATE OF PROCEDURE:  05/25/2014 DATE OF DISCHARGE:                              OPERATIVE REPORT   PREOPERATIVE DIAGNOSIS:  Impacted wisdom teeth #1, 16, 17, 32.  POSTOPERATIVE DIAGNOSIS:  Impacted wisdom teeth #1, 16, 17, 32.  PROCEDURE:  Extraction of wisdom teeth #1, 16, 17, 32.  SURGEON:  Georgia LopesScott M. Anida Deol, MD  ANESTHESIA:  General, Dr, Jean RosenthalJackson attending, nasal intubation.  DESCRIPTION OF PROCEDURE:  The patient was taken to the operating room, placed on the table in supine position.  General anesthesia was administered intravenously and a nasal endotracheal tube was placed and secured.  The eyes were protected.  The patient was draped for the procedure.  Time-out was performed.  The posterior pharynx was suctioned.  A throat pack was placed.  Then, 2% lidocaine with 1:100,000 epinephrine was infiltrated in an inferior alveolar block on the right and left side of the mandible and buccal and palatal infiltration was administered in the maxilla on the right and left side adjacent to the wisdom teeth, a total of 10 mL was utilized.  A bite block and sweetheart retractor were placed in the right side of the mouth and the left side was operated first.  A #15 blade was used to make an incision overlying teeth #16 and 17.  The periosteum was reflected to expose the bone and the bone was removed with the Stryker handpiece under irrigation.  Tooth #17 was sectioned and removed with a 301 elevator, tooth #16 proved difficult to remove.  The root of tooth #15 was in the field and was exposed but not injured during the removal of tooth #16. The tooth was elevated with a larger 301 and Potts elevator and filing was removed with the universal forceps.  The root mesial  buccal fractured at the time of removal and this was retrieved using the root tip pick, then the socket was curetted, irrigated, and closed with 3-0 chromic.  The lower socket was curetted, irrigated, and closed with 3-0 chromic as well.  The bite block and sweetheart retractor were repositioned to the other side of the mouth.  A 15 blade was used to make an incision overlying teeth #1 and 32.  The periosteum was reflected with a periosteal elevator.  Bone was removed with a Stryker handpiece.  Tooth #32 was sectioned and removed in pieces with a 301 elevator, and then the tooth #1 was removed using the 301 elevator and the Potts elevator.  The sockets were then curetted, irrigated, and closed with 3-0 chromic.  The oral cavity was inspected and found to have good contour and hemostasis.  The oral cavity was irrigated, suctioned, and the throat pack was removed.  The patient was awakened, taken to the recovery room, breathing spontaneously in good condition.  ESTIMATED BLOOD LOSS:  Minimal.  COMPLICATIONS:  None.  SPECIMENS:  None.     Georgia LopesScott M. Burris Matherne, M.D.     SMJ/MEDQ  D:  05/25/2014  T:  05/25/2014  Job:  [id number]

## 2014-05-25 NOTE — Op Note (Signed)
05/25/2014  9:21 AM  PATIENT:  Jonathan Bird  23 y.o. male  PRE-OPERATIVE DIAGNOSIS:  impacted wisdom teeth 1, 16, 17, 32  POST-OPERATIVE DIAGNOSIS:  SAME  PROCEDURE:  Procedure(s): EXTRACTION WISDOM TEETH, Extraction teeth # 1, 16, 17, 32.  SURGEON:  Surgeon(s): Jonathan Bird, DDS  ANESTHESIA:   local and general  EBL:  minimal  DRAINS: none   SPECIMEN:  No Specimen  COUNTS:  YES  PLAN OF CARE: Discharge to home after PACU  PATIENT DISPOSITION:  PACU - hemodynamically stable.   PROCEDURE DETAILS: Dictation # [id number]  Jonathan Bird, DMD 05/25/2014 9:21 AM

## 2014-05-25 NOTE — Anesthesia Procedure Notes (Signed)
Procedure Name: Intubation Date/Time: 05/25/2014 8:31 AM Performed by: Coralee RudFLORES, Mikelle Myrick Pre-anesthesia Checklist: Patient identified Patient Re-evaluated:Patient Re-evaluated prior to inductionOxygen Delivery Method: Circle system utilized Preoxygenation: Pre-oxygenation with 100% oxygen Intubation Type: IV induction Ventilation: Mask ventilation without difficulty Laryngoscope Size: Miller and 3 Grade View: Grade I Nasal Tubes: Right, Nasal Rae, Magill forceps- large, utilized and Nasal prep performed Number of attempts: 1 Placement Confirmation: ETT inserted through vocal cords under direct vision,  positive ETCO2 and breath sounds checked- equal and bilateral Secured at: 25 cm Tube secured with: Tape Dental Injury: Teeth and Oropharynx as per pre-operative assessment

## 2014-05-25 NOTE — Anesthesia Postprocedure Evaluation (Signed)
  Anesthesia Post-op Note  Patient: Jonathan Bird  Procedure(s) Performed: Procedure(s): EXTRACTION WISDOM TEETH, Extraction teeth # 1, 16, 17, 32. (N/A)  Patient Location: PACU  Anesthesia Type:General  Level of Consciousness: awake, alert  and patient cooperative  Airway and Oxygen Therapy: Patient Spontanous Breathing  Post-op Pain: none  Post-op Assessment: Post-op Vital signs reviewed, Patient's Cardiovascular Status Stable, Respiratory Function Stable, Patent Airway, No signs of Nausea or vomiting and Pain level controlled  Post-op Vital Signs: Reviewed and stable  Last Vitals:  Filed Vitals:   05/25/14 1011  BP:   Pulse: 89  Temp:   Resp: 20    Complications: No apparent anesthesia complications

## 2014-05-25 NOTE — Discharge Instructions (Signed)
Sinus precautions after surgery:  1. Avoid blowing your nose.  2. Avoid sneezing. If you might sneeze, Keep her mouth open and do not pinch your nose closed.  3. Avoid sucking on straws. Avoid smoking.  4. Do not lift objects weighing more than 20 pounds  Call if questions arise.   MOUTH CARE AFTER SURGERY  FACTS:  Ice used in ice bag helps keep the swelling down, and can help lessen the pain.  It is easier to treat pain BEFORE it happens.  Spitting disturbs the clot and may cause bleeding to start again, or to get worse.  Smoking delays healing and can cause complications.  Sharing prescriptions can be dangerous. Do not take medications not recently prescribed for you.  Antibiotics may stop birth control pills from working. Use other means of birth control while on antibiotics.  Warm salt water rinses after the first 24 hours will help lessen the swelling: Use 1/2 teaspoonful of table salt per oz.of water. DO NOT:  Do not spit. Do not drink through a straw.  Strongly advised not to smoke, dip snuff or chew tobacco at least for 3 days.  Do not eat sharp or crunchy foods. Avoid the area of surgery when chewing.  Do not stop your antibiotics before your instructions say to do so.  Do not eat hot foods until bleeding has stopped. If you need to, let your food cool down to room temperature. EXPECT:  Some swelling, especially first 2-3 days.  Soreness or discomfort in varying degrees. Follow your dentist's instructions about how to handle pain before it starts.  Pinkish saliva or light blood in saliva, or on your pillow in the morning. This can last around 24 hours.  Bruising inside or outside the mouth. This may not show up until 2-3 days after surgery. Don't worry, it will go away in time.  Pieces of "bone" may work themselves loose. It's OK. If they bother you, let us know. WHAT TO DO IMMEDIATELY AFTER SURGERY:  Bite on the gauze with steady pressure for 1-2 hours. Don't chew on the gauze.   Do not lie down flat. Raise your head support especially for the first 24 hours.  Apply ice to your face on the side of the surgery. You may apply it 20 minutes on and a few minutes off. Ice for 8-12 hours. You may use ice up to 24 hours.  Before the numbness wears off, take a pain pill as instructed.  Prescription pain medication is not always required. SWELLING:  Expect swelling for the first couple of days. It should get better after that.  If swelling increases 3 days or so after surgery; let us know as soon as possible. FEVER:  Take Tylenol every 4 hours if needed to lower your temperature, especially if it is at 100F or higher.  Drink lots of fluids.  If the fever does not go away, let us know. BREATHING TROUBLE:  Any unusual difficulty breathing means you have to have someone bring you to the emergency room ASAP BLEEDING:  Light oozing is expected for 24 hours or so.  Prop head up with pillows  Avoid spitting  Do not confuse bright red fresh flowing blood with lots of saliva colored with a little bit of blood.  If you notice some bleeding, place gauze or a tea bag where it is bleeding and apply CONSTANT pressure by biting down for 1 hour. Avoid talking during this time. Do not remove the gauze or tea  bag during this hour to "check" the bleeding.  If you notice bright RED bleeding FLOWING out of particular area, and filling the floor of your mouth, put a wad of gauze on that area, bite down firmly and constantly. Call us immediately. If we're closed, have someone bring you to the emergency room. ORAL HYGIENE:  Brush your teeth as usual after meals and before bedtime.  Use a soft toothbrush around the area of surgery.  DO NOT AVOID BRUSHING. Otherwise bacteria(germs) will grow and may delay healing or encourage infection.  Since you cannot spit, just gently rinse and let the water flow out of your mouth.  DO NOT SWISH HARD. EATING:  Cool liquids are a good point to start. Increase  to soft foods as tolerated. PRESCRIPTIONS:  Follow the directions for your prescriptions exactly as written.  If your doctor gave you a narcotic pain medication, do not drive, operate machinery or drink alcohol when on that medication. QUESTIONS:  Call our office during office hours  General Anesthesia, Adult, Care After  Refer to this sheet in the next few weeks. These instructions provide you with information on caring for yourself after your procedure. Your health care provider may also give you more specific instructions. Your treatment has been planned according to current medical practices, but problems sometimes occur. Call your health care provider if you have any problems or questions after your procedure.  WHAT TO EXPECT AFTER THE PROCEDURE  After the procedure, it is typical to experience:  Sleepiness.  Nausea and vomiting. HOME CARE INSTRUCTIONS  For the first 24 hours after general anesthesia:  Have a responsible person with you.  Do not drive a car. If you are alone, do not take public transportation.  Do not drink alcohol.  Do not take medicine that has not been prescribed by your health care provider.  Do not sign important papers or make important decisions.  You may resume a normal diet and activities as directed by your health care provider.  Change bandages (dressings) as directed.  If you have questions or problems that seem related to general anesthesia, call the hospital and ask for the anesthetist or anesthesiologist on call. SEEK MEDICAL CARE IF:  You have nausea and vomiting that continue the day after anesthesia.  You develop a rash. SEEK IMMEDIATE MEDICAL CARE IF:  You have difficulty breathing.  You have chest pain.  You have any allergic problems. Document Released: 03/05/2001 Document Revised: 07/30/2013 Document Reviewed: 06/12/2013  [facility] Patient Information 2014 [place], [place].

## 2014-05-25 NOTE — Anesthesia Preprocedure Evaluation (Addendum)
Anesthesia Evaluation  Patient identified by MRN, date of birth, ID band Patient awake  General Assessment Comment:Autistic and bipolar.  Cooperative, but unable to answer questions.    Reviewed: Allergy & Precautions, H&P , NPO status , Patient's Chart, lab work & pertinent test results  History of Anesthesia Complications Negative for: history of anesthetic complications  Airway Mallampati: I TM Distance: >3 FB Neck ROM: Full    Dental  (+) Teeth Intact   Pulmonary neg pulmonary ROS,  breath sounds clear to auscultation  Pulmonary exam normal       Cardiovascular hypertension, Rhythm:Regular Rate:Normal     Neuro/Psych PSYCHIATRIC DISORDERS (autism) Bipolar Disorder Autistic,  Cooperative with short episodes of shoutingAutistic and bipolar    GI/Hepatic negative GI ROS, Neg liver ROS,   Endo/Other  diabetes (glu 109), Well ControlledMorbid obesityDiet controlled CBG this AM 109  Renal/GU negative Renal ROS     Musculoskeletal negative musculoskeletal ROS (+)   Abdominal (+) + obese,  Abdomen: soft.    Peds  Hematology negative hematology ROS (+)   Anesthesia Other Findings   Reproductive/Obstetrics negative OB ROS                         Anesthesia Physical Anesthesia Plan  ASA: III  Anesthesia Plan: General   Post-op Pain Management:    Induction: Intravenous  Airway Management Planned: Nasal ETT  Additional Equipment:   Intra-op Plan:   Post-operative Plan: Extubation in OR  Informed Consent: I have reviewed the patients History and Physical, chart, labs and discussed the procedure including the risks, benefits and alternatives for the proposed anesthesia with the patient or authorized representative who has indicated his/her understanding and acceptance.   Dental advisory given and Consent reviewed with POA  Plan Discussed with: CRNA, Anesthesiologist and  Surgeon  Anesthesia Plan Comments: (Plan routine monitors, GETA)       Anesthesia Quick Evaluation

## 2014-05-25 NOTE — Transfer of Care (Signed)
Immediate Anesthesia Transfer of Care Note  Patient: Jonathan Bird  Procedure(s) Performed: Procedure(s): EXTRACTION WISDOM TEETH, Extraction teeth # 1, 16, 17, 32. (N/A)  Patient Location: PACU  Anesthesia Type:General  Level of Consciousness: awake, sedated and patient cooperative  Airway & Oxygen Therapy: Patient Spontanous Breathing and Patient connected to face mask oxygen  Post-op Assessment: Report given to PACU RN, Post -op Vital signs reviewed and stable and Patient moving all extremities  Post vital signs: Reviewed and stable  Complications: No apparent anesthesia complications

## 2014-05-25 NOTE — H&P (Signed)
H&P documentation  -History and Physical Reviewed  -Patient has been re-examined  -No change in the plan of care  Jonathan Bird M  

## 2014-05-27 ENCOUNTER — Encounter (HOSPITAL_COMMUNITY): Payer: Self-pay | Admitting: Oral Surgery

## 2020-01-21 ENCOUNTER — Ambulatory Visit (HOSPITAL_COMMUNITY)
Admission: RE | Admit: 2020-01-21 | Discharge: 2020-01-21 | Disposition: A | Discharge status: Home / Self Care | Payer: Medicare Other | Source: Home / Self Care | Attending: Psychiatry | Admitting: Psychiatry

## 2020-01-21 ENCOUNTER — Encounter (HOSPITAL_COMMUNITY): Payer: Self-pay | Admitting: *Deleted

## 2020-01-21 ENCOUNTER — Emergency Department (HOSPITAL_COMMUNITY)
Admission: EM | Admit: 2020-01-21 | Discharge: 2020-01-22 | Disposition: A | Discharge status: Home / Self Care | Payer: Medicare Other | Attending: Emergency Medicine | Admitting: Emergency Medicine

## 2020-01-21 ENCOUNTER — Other Ambulatory Visit: Payer: Self-pay

## 2020-01-21 DIAGNOSIS — F840 Autistic disorder: Secondary | ICD-10-CM | POA: Diagnosis not present

## 2020-01-21 DIAGNOSIS — R4689 Other symptoms and signs involving appearance and behavior: Secondary | ICD-10-CM

## 2020-01-21 DIAGNOSIS — E119 Type 2 diabetes mellitus without complications: Secondary | ICD-10-CM | POA: Insufficient documentation

## 2020-01-21 DIAGNOSIS — F918 Other conduct disorders: Secondary | ICD-10-CM | POA: Insufficient documentation

## 2020-01-21 DIAGNOSIS — F319 Bipolar disorder, unspecified: Secondary | ICD-10-CM | POA: Diagnosis not present

## 2020-01-21 DIAGNOSIS — Z046 Encounter for general psychiatric examination, requested by authority: Secondary | ICD-10-CM | POA: Diagnosis present

## 2020-01-21 DIAGNOSIS — Z20822 Contact with and (suspected) exposure to covid-19: Secondary | ICD-10-CM | POA: Insufficient documentation

## 2020-01-21 DIAGNOSIS — I10 Essential (primary) hypertension: Secondary | ICD-10-CM | POA: Diagnosis not present

## 2020-01-21 DIAGNOSIS — Z79899 Other long term (current) drug therapy: Secondary | ICD-10-CM | POA: Diagnosis not present

## 2020-01-21 LAB — COMPREHENSIVE METABOLIC PANEL
ALT: 26 U/L (ref 0–44)
AST: 27 U/L (ref 15–41)
Albumin: 4.7 g/dL (ref 3.5–5.0)
Alkaline Phosphatase: 107 U/L (ref 38–126)
Anion gap: 11 (ref 5–15)
BUN: 17 mg/dL (ref 6–20)
CO2: 29 mmol/L (ref 22–32)
Calcium: 9.4 mg/dL (ref 8.9–10.3)
Chloride: 100 mmol/L (ref 98–111)
Creatinine, Ser: 0.91 mg/dL (ref 0.61–1.24)
GFR calc Af Amer: >60 mL/min (ref >60)
GFR calc non Af Amer: >60 mL/min (ref >60)
Glucose, Bld: 89 mg/dL (ref 70–99)
Potassium: 3.6 mmol/L (ref 3.5–5.1)
Sodium: 140 mmol/L (ref 135–145)
Total Bilirubin: 0.6 mg/dL (ref 0.3–1.2)
Total Protein: 8.3 g/dL — ABNORMAL HIGH (ref 6.5–8.1)

## 2020-01-21 LAB — CBC WITH DIFFERENTIAL/PLATELET
Abs Immature Granulocytes: 0.01 10*3/uL (ref 0.00–0.07)
Basophils Absolute: 0 10*3/uL (ref 0.0–0.1)
Basophils Relative: 0 %
Eosinophils Absolute: 0.1 10*3/uL (ref 0.0–0.5)
Eosinophils Relative: 3 %
HCT: 47 % (ref 39.0–52.0)
Hemoglobin: 15.4 g/dL (ref 13.0–17.0)
Immature Granulocytes: 0 %
Lymphocytes Relative: 32 %
Lymphs Abs: 1.6 10*3/uL (ref 0.7–4.0)
MCH: 27.5 pg (ref 26.0–34.0)
MCHC: 32.8 g/dL (ref 30.0–36.0)
MCV: 83.8 fL (ref 80.0–100.0)
Monocytes Absolute: 0.5 10*3/uL (ref 0.1–1.0)
Monocytes Relative: 9 %
Neutro Abs: 2.7 10*3/uL (ref 1.7–7.7)
Neutrophils Relative %: 56 %
Platelets: 190 10*3/uL (ref 150–400)
RBC: 5.61 MIL/uL (ref 4.22–5.81)
RDW: 14.4 % (ref 11.5–15.5)
WBC: 5 10*3/uL (ref 4.0–10.5)
nRBC: 0 % (ref 0.0–0.2)

## 2020-01-21 LAB — ETHANOL: Alcohol, Ethyl (B): <10 mg/dL (ref <10)

## 2020-01-21 LAB — SALICYLATE LEVEL: Salicylate Lvl: <7 mg/dL — ABNORMAL LOW (ref 7.0–30.0)

## 2020-01-21 LAB — ACETAMINOPHEN LEVEL: Acetaminophen (Tylenol), Serum: <10 ug/mL — ABNORMAL LOW (ref 10–30)

## 2020-01-21 MED ORDER — GUANFACINE HCL 1 MG PO TABS
1.0000 mg | ORAL_TABLET | Freq: Two times a day (BID) | ORAL | Status: DC
  Administered 2020-01-21 – 2020-01-22 (×2): 1 mg via ORAL
  Filled 2020-01-21 (×4): qty 1

## 2020-01-21 MED ORDER — DIVALPROEX SODIUM 250 MG PO DR TAB
250.0000 mg | DELAYED_RELEASE_TABLET | Freq: Two times a day (BID) | ORAL | Status: DC
  Administered 2020-01-21 – 2020-01-22 (×2): 250 mg via ORAL
  Filled 2020-01-21 (×2): qty 1

## 2020-01-21 MED ORDER — ALUM & MAG HYDROXIDE-SIMETH 200-200-20 MG/5ML PO SUSP
30.0000 mL | Freq: Four times a day (QID) | ORAL | Status: DC | PRN
Start: 2020-01-21 — End: 2020-01-22

## 2020-01-21 MED ORDER — PALIPERIDONE ER 6 MG PO TB24
6.0000 mg | ORAL_TABLET | Freq: Every day | ORAL | Status: DC
  Administered 2020-01-21: 6 mg via ORAL
  Filled 2020-01-21 (×2): qty 1

## 2020-01-21 MED ORDER — ZIPRASIDONE MESYLATE 20 MG IM SOLR
20.0000 mg | Freq: Once | INTRAMUSCULAR | Status: AC
  Administered 2020-01-21: 20 mg via INTRAMUSCULAR
  Filled 2020-01-21: qty 20

## 2020-01-21 MED ORDER — ZIPRASIDONE MESYLATE 20 MG IM SOLR
20.0000 mg | INTRAMUSCULAR | Status: AC | PRN
  Administered 2020-01-21: 20 mg via INTRAMUSCULAR
  Filled 2020-01-21 (×2): qty 20

## 2020-01-21 MED ORDER — OLANZAPINE 5 MG PO TBDP
5.0000 mg | ORAL_TABLET | Freq: Three times a day (TID) | ORAL | Status: DC | PRN
Start: 2020-01-21 — End: 2020-01-22
  Administered 2020-01-22 (×2): 5 mg via ORAL
  Filled 2020-01-21 (×2): qty 1

## 2020-01-21 MED ORDER — STERILE WATER FOR INJECTION IJ SOLN
INTRAMUSCULAR | Status: AC
  Filled 2020-01-21: qty 10

## 2020-01-21 MED ORDER — PALIPERIDONE ER 3 MG PO TB24
3.0000 mg | ORAL_TABLET | Freq: Every day | ORAL | Status: DC
  Administered 2020-01-22: 3 mg via ORAL
  Filled 2020-01-21 (×2): qty 1

## 2020-01-21 MED ORDER — STERILE WATER FOR INJECTION IJ SOLN
INTRAMUSCULAR | Status: AC
  Administered 2020-01-21 – 2020-01-22 (×2): 1.2 mL
  Filled 2020-01-21: qty 10

## 2020-01-21 MED ORDER — ACETAMINOPHEN 325 MG PO TABS
650.0000 mg | ORAL_TABLET | ORAL | Status: DC | PRN
Start: 2020-01-21 — End: 2020-01-22

## 2020-01-21 MED ORDER — ONDANSETRON HCL 4 MG PO TABS: 4.0000 mg | ORAL_TABLET | Freq: Three times a day (TID) | ORAL | Status: DC | PRN

## 2020-01-21 MED ORDER — ZOLPIDEM TARTRATE 5 MG PO TABS
5.0000 mg | ORAL_TABLET | Freq: Every evening | ORAL | Status: DC | PRN
Start: 2020-01-21 — End: 2020-01-22
  Administered 2020-01-22: 5 mg via ORAL
  Filled 2020-01-21: qty 1

## 2020-01-21 MED ORDER — LORAZEPAM 1 MG PO TABS
1.0000 mg | ORAL_TABLET | ORAL | Status: DC | PRN
Start: 2020-01-21 — End: 2020-01-22

## 2020-01-21 NOTE — Progress Notes (Signed)
Received Jonathan Bird at the change of shift screaming and banging on his bed. Bed pads were placed on the bed to prevent injury. He is behavior continued as stated above and he was given Geodon 20 mg with positive affects. Later he was compliant with his PO medications. He eventually calmed down and drifted off to sleep. The sitter remained at the bedside. He woke up at 0015 hrs and his behavior escalated to biting his arms, hitting the wall and the pane to the bedroom door. He was given Zyprexa and an Ambien without positive effects. The cartoons were turned on to help calm him down.  He woke up at 0500 hrs and was calm in his room and talking at intervals with the sitter.
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
## 2020-01-21 NOTE — ED Notes (Signed)
2 Pt belonging Bags and one black bookbag located in locker 28
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
## 2020-01-21 NOTE — [facility] Counselor (Signed)
Disposition: Shuvon Rankin, NP recommends patient be observed overnight for safety and stabilization as patient's guardian feels he is not safe for discharge. NP recommends guardian follow up with Dr. Jannifer Franklin for medication recommendations.  This counselor discussed disposition with guardian, explaining that patient could be psychiatrically cleared to go home but that if he is uncomfortable our provider stated patient could stay in hospital overnight for continued observation. This counselor relayed that the provider did not recommend any medication changes at this time and that he contact Dr. Jannifer Franklin for recommendations. Guardian appeared concerned because he states he took out an IVC that is good for 72 hours. This counselor explained patient would be seen by psychiatry in the morning and at that time a decision would be made. Counselor explained that psychiatry could rescind the IVC if felt he did not meet criteria for psychiatric hospitalization. Guardian requested he be present at time of psych assessment tomorrow. Counselor informed him the doctor typically begins rounding around 9:00 am but that I could not guarantee a specific time, however stated that he would be contacted by psych team tomorrow and that we wanted his input. Guardian states that "there is a real problem with mental health out here and nobody cares." Counselor told guardian she was trying to make process clear to him. He then requested counselor's name, title, and contact information. This counselor provided all requested information.
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
## 2020-01-21 NOTE — ED Notes (Signed)
Pt continues to yell out randomly. Pt agitated at times.  Episode of biting self, redirected and reassured

## 2020-01-22 DIAGNOSIS — F840 Autistic disorder: Secondary | ICD-10-CM

## 2020-01-22 DIAGNOSIS — R454 Irritability and anger: Secondary | ICD-10-CM

## 2020-01-22 DIAGNOSIS — R4689 Other symptoms and signs involving appearance and behavior: Secondary | ICD-10-CM

## 2020-01-22 DIAGNOSIS — F918 Other conduct disorders: Secondary | ICD-10-CM | POA: Diagnosis not present

## 2020-01-22 MED ORDER — ZIPRASIDONE MESYLATE 20 MG IM SOLR
20.0000 mg | Freq: Once | INTRAMUSCULAR | Status: AC
  Administered 2020-01-22: 20 mg via INTRAMUSCULAR
  Filled 2020-01-22: qty 20

## 2020-01-22 NOTE — Discharge Instructions (Signed)
For your behavioral health needs, you are advised to continue treatment with your current outpatient providers:       Thedore Mins, MD      [facility]      [address]      [place], [place] [address]      [phone]       [facility]      [address]      [place], [place] [address]      [phone]

## 2020-01-22 NOTE — BH Assessment (Signed)
[facility] Assessment Progress Note  Per Nelly Rout, MD, this pt does not require psychiatric hospitalization at this time.  Pt presents under IVC initiated by pt's legal guardian and initially upheld by EDP Gwyneth Sprout, MD, but now rescinded by Dr Lucianne Muss.  Pt is to be discharged from [facility] with recommendation to continue treatment with his current outpatient providers, Thedore Mins, MD and [facility].  This has been included in pt's discharge instructions.  A social work consult has been ordered to facilitate pt's return to his residential facility.  Lavell Islam, CSW has been informed, and she reports that the has been in communication with the legal guardian.  Pt's nurse has been notified of disposition.  Doylene Canning, MA Triage Specialist [phone]

## 2020-01-22 NOTE — ED Notes (Signed)
Pt took a shower with minimal assist. Walking around in room. Calm at this time.

## 2020-01-22 NOTE — Progress Notes (Addendum)
2:47p CSW received notice from NP that she spoke with patient's legal guardian who reports he will be here in an hour to pick patient up. NP asked if CSW could reach back out to his legal guardian about resources for possible respite can or additional support for him and patient. Mr. Raquel James reports this is something they have looked into in the past, but the agency patient is with does not do any respite. CSW recommended [facility] as well. Mr. Raquel James was thankful for CSW's time. No other needs reported.   12:30p TOC consult received stating that patient was psych cleared and his legal guardian/caretaker needed to be contacted to pick patient up. CSW spoke with Adonis Huguenin and informed him of the disposition. Mr. Raquel James expressed some concerns about patient being psych cleared with the behaviors he initially presented with. CSW informed him of the psych process in general terms. CSW then spoke with Ophelia Shoulder, NP who evaluated patient to gain more information on why patient was cleared specifically. Marlinda Mike reports she would contact patient's legal guardian when she had a chance, but was okay with CSW passing on the information she provided about with patient was cleared and follow up information.   CSW spoke with Mr. Raquel James again and also included his QP on the phone. CSW spoke with both of them and addressed their concerns, but they stated they still would like to speak with the NP before Mr. Raquel James picks patient up. CSW secure messaged NP that they would like to speak with her directly. CSW will continue to follow up with discharge needs.  Geralyn Corwin, LCSW Transitions of Care Department [facility] ED [phone]

## 2020-01-22 NOTE — ED Provider Notes (Signed)
  Physical Exam  BP (!) 142/103 (BP Location: Right Arm)   Pulse 92   Temp 98.1 F (36.7 C) (Axillary)   Resp 20   SpO2 100%   Physical Exam  ED Course/Procedures     Procedures  MDM  Patient's been cleared by psychiatry for discharge       Benjiman Core, MD 01/22/20 1503

## 2020-01-22 NOTE — ED Notes (Signed)
Nurse requested that do not get pt vital signs she said  it may make him upset.

## 2020-01-22 NOTE — Consult Note (Signed)
Telepsych Consultation   Reason for Consult:  Evaluation  Referring Physician:  EDP Location of Patient: [name] Location of Provider: [facility]  Patient Identification: Jonathan Bird MRN:  [id number] Principal Diagnosis: Outbursts of anger Diagnosis:  Principal Problem:   Outbursts of anger Active Problems:   Autism disorder  Total Time spent with patient: 30 minutes       Tele Assessment Jonathan Bird, 29 y.o., male patient presented to [name] ED accompanied by his caregiver for evaluation of aggressive behaviors and emotional outburst.   Patient seen via telepsych by this provider and Dr. Lucianne Muss. Chart reviewed.  On evaluation Jonathan Bird is limited in verbal communication but is able to relay that he is doing "okay" today.  Per nursing, he was agitated last night but no further incidents after receiving prn medication and sleeping.   Of note, patient's psychiatrist was contacted yesterday, who did not recommend and additional medication changes at that time.   During evaluation Jonathan Bird is standing by the bed; He is alert/oriented to self, due to his speech limitations, I am unable to determine further orientation. He is active and moving his hands while standing against the wall but states his name "Jonathan Bird" and demonstrates willingness to participate with MH team assessment. His mood is congruent with affect.  Patient's speech is limited but he makes good eye contact.  There is no indication that he is currently responding to internal/external stimuli or experiencing delusional thought content.  No physically aggressive behaviors or destroying property concerns seen today. Patient has remained calm throughout assessment and has answered questions appropriately.     Past Psychiatric History: Autism, FAS Risk to Self: Suicidal Ideation: No Suicidal Intent: No Is patient at risk for suicide?: No Suicidal Plan?: No Access to Means: No What has been  your use of drugs/alcohol within the last 12 months?: denies How many times?: 0 Other Self Harm Risks: none Triggers for Past Attempts: None known Intentional Self Injurious Behavior: None Risk to Others: Homicidal Ideation: No Thoughts of Harm to Others: No Current Homicidal Intent: No Current Homicidal Plan: No Access to Homicidal Means: No Identified Victim: none History of harm to others?: Yes Assessment of Violence: On admission Does patient have access to weapons?: No Criminal Charges Pending?: No Does patient have a court date: No Prior Inpatient Therapy: Prior Inpatient Therapy: No Prior Outpatient Therapy: Prior Outpatient Therapy: Yes Prior Therapy Dates: ongoing Prior Therapy Facilty/Provider(s): [facility]; [facility] Reason for Treatment: med mangement, day treatment Does patient have an ACCT team?: No Does patient have Intensive In-House Services?  : No Does patient have [facility] services? : No Does patient have P4CC services?: No  Past Medical History:  Past Medical History:  Diagnosis Date  . Autism    outbursts  . Autism spectrum   . Bipolar disorder (HCC)   . Diabetes mellitus without complication (HCC)    boarderline  . Hypertension     Past Surgical History:  Procedure Laterality Date  . NO PAST SURGERIES    . TOOTH EXTRACTION N/A 05/25/2014   Procedure: EXTRACTION WISDOM TEETH, Extraction teeth # 1, 16, 17, 32.;  Surgeon: Georgia Lopes, DDS;  Location: [facility] OR;  Service: Oral Surgery;  Laterality: N/A;   Family History: History reviewed. No pertinent family history. Family Psychiatric  History:  Social History:  Social History   Substance and Sexual Activity  Alcohol Use No     Social History  Substance and Sexual Activity  Drug Use No    Social History   Socioeconomic History  . Marital status: Single    Spouse name: Not on file  . Number of children: Not on file  . Years of education: Not on file  . Highest  education level: Not on file  Occupational History  . Not on file  Tobacco Use  . Smoking status: Never Smoker  . Smokeless tobacco: Never Used  Substance and Sexual Activity  . Alcohol use: No  . Drug use: No  . Sexual activity: Never  Other Topics Concern  . Not on file  Social History Narrative  . Not on file   Social Determinants of Health   Financial Resource Strain:   . Difficulty of Paying Living Expenses: Not on file  Food Insecurity:   . Worried About Programme researcher, broadcasting/film/video in the Last Year: Not on file  . Ran Out of Food in the Last Year: Not on file  Transportation Needs:   . Lack of Transportation (Medical): Not on file  . Lack of Transportation (Non-Medical): Not on file  Physical Activity:   . Days of Exercise per Week: Not on file  . Minutes of Exercise per Session: Not on file  Stress:   . Feeling of Stress : Not on file  Social Connections:   . Frequency of Communication with Friends and Family: Not on file  . Frequency of Social Gatherings with Friends and Family: Not on file  . Attends Religious Services: Not on file  . Active Member of Clubs or Organizations: Not on file  . Attends Banker Meetings: Not on file  . Marital Status: Not on file   Additional Social History:    Allergies:  No Known Allergies  Labs:  Results for orders placed or performed during the hospital encounter of 01/21/20 (from the past 48 hour(s))  Comprehensive metabolic panel     Status: Abnormal   Collection Time: 01/21/20 12:45 PM  Result Value Ref Range   Sodium 140 135 - 145 mmol/L   Potassium 3.6 3.5 - 5.1 mmol/L   Chloride 100 98 - 111 mmol/L   CO2 29 22 - 32 mmol/L   Glucose, Bld 89 70 - 99 mg/dL   BUN 17 6 - 20 mg/dL   Creatinine, Ser 4.70 0.61 - 1.24 mg/dL   Calcium 9.4 8.9 - 76.1 mg/dL   Total Protein 8.3 (H) 6.5 - 8.1 g/dL   Albumin 4.7 3.5 - 5.0 g/dL   AST 27 15 - 41 U/L   ALT 26 0 - 44 U/L   Alkaline Phosphatase 107 38 - 126 U/L   Total  Bilirubin 0.6 0.3 - 1.2 mg/dL   GFR calc non Af Amer >60 >60 mL/min   GFR calc Af Amer >60 >60 mL/min   Anion gap 11 5 - 15    Comment: Performed at [facility], [address], [place], [place] [address]  Acetaminophen level     Status: Abnormal   Collection Time: 01/21/20 12:45 PM  Result Value Ref Range   Acetaminophen (Tylenol), Serum <10 (L) 10 - 30 ug/mL    Comment: (NOTE) Therapeutic concentrations vary significantly. A range of 10-30 ug/mL  may be an effective concentration for many patients. However, some  are best treated at concentrations outside of this range. Acetaminophen concentrations >150 ug/mL at 4 hours after ingestion  and >50 ug/mL at 12 hours after ingestion are often associated with  toxic reactions. Performed at [facility], [address] [place], [place] [address]   Ethanol     Status: None   Collection Time: 01/21/20 12:45 PM  Result Value Ref Range   Alcohol, Ethyl (B) <10 <10 mg/dL    Comment: (NOTE) Lowest detectable limit for serum alcohol is 10 mg/dL. For medical purposes only. Performed at [facility], [address], [place], [place] [address]   Salicylate level     Status: Abnormal   Collection Time: 01/21/20 12:45 PM  Result Value Ref Range   Salicylate Lvl <7.0 (L) 7.0 - 30.0 mg/dL    Comment: Performed at [facility], [address] [place], [place] [address]  CBC with Differential     Status: None   Collection Time: 01/21/20 12:45 PM  Result Value Ref Range   WBC 5.0 4.0 - 10.5 K/uL   RBC 5.61 4.22 - 5.81 MIL/uL   Hemoglobin 15.4 13.0 - 17.0 g/dL   HCT 12.4 58.0 - 99.8 %   MCV 83.8 80.0 - 100.0 fL   MCH 27.5 26.0 - 34.0 pg   MCHC 32.8 30.0 - 36.0 g/dL   RDW 33.8 25.0 - 53.9 %   Platelets 190 150 - 400 K/uL   nRBC 0.0 0.0 - 0.2 %   Neutrophils Relative % 56 %   Neutro Abs 2.7 1.7 - 7.7 K/uL   Lymphocytes Relative 32 %   Lymphs Abs 1.6 0.7 - 4.0 K/uL    Monocytes Relative 9 %   Monocytes Absolute 0.5 0.1 - 1.0 K/uL   Eosinophils Relative 3 %   Eosinophils Absolute 0.1 0.0 - 0.5 K/uL   Basophils Relative 0 %   Basophils Absolute 0.0 0.0 - 0.1 K/uL   Immature Granulocytes 0 %   Abs Immature Granulocytes 0.01 0.00 - 0.07 K/uL    Comment: Performed at [facility], [address], [place], [place] [address]    Medications:  Current Facility-Administered Medications  Medication Dose Route Frequency Provider Last Rate Last Admin  . acetaminophen (TYLENOL) tablet 650 mg  650 mg Oral Q4H PRN Fayrene Helper, PA-C      . alum & mag hydroxide-simeth (MAALOX/MYLANTA) 200-200-20 MG/5ML suspension 30 mL  30 mL Oral Q6H PRN Fayrene Helper, PA-C      . divalproex (DEPAKOTE) DR tablet 250 mg  250 mg Oral BID Fayrene Helper, PA-C   250 mg at 01/22/20 1020  . guanFACINE (TENEX) tablet 1 mg  1 mg Oral BID Fayrene Helper, PA-C   1 mg at 01/22/20 1020  . OLANZapine zydis (ZYPREXA) disintegrating tablet 5 mg  5 mg Oral Q8H PRN Fayrene Helper, PA-C   5 mg at 01/22/20 0109   And  . LORazepam (ATIVAN) tablet 1 mg  1 mg Oral PRN Fayrene Helper, PA-C      . ondansetron [facility]) tablet 4 mg  4 mg Oral Q8H PRN Fayrene Helper, PA-C      . paliperidone (INVEGA) 24 hr tablet 3 mg  3 mg Oral Daily Fayrene Helper, PA-C   3 mg at 01/22/20 1020  . paliperidone (INVEGA) 24 hr tablet 6 mg  6 mg Oral QHS Fayrene Helper, PA-C   6 mg at 01/21/20 2057  . zolpidem (AMBIEN) tablet 5 mg  5 mg Oral QHS PRN Fayrene Helper, PA-C   5 mg at 01/22/20 0110   Current Outpatient Medications  Medication Sig Dispense Refill  . amantadine (SYMMETREL) 100 MG capsule Take  100 mg by mouth 2 (two) times daily.    . carbamazepine (TEGRETOL) 200 MG tablet Take 200 mg by mouth 2 (two) times daily.    . cetirizine (ZYRTEC) 10 MG tablet Take 10 mg by mouth daily.    . chlorproMAZINE (THORAZINE) 100 MG tablet Take 100 mg by mouth 3 (three) times daily.    . divalproex (DEPAKOTE ER) 250 MG 24 hr tablet Take  250-500 mg by mouth See admin instructions. Take 250mg  in AM & 500mg  in evening    . fluticasone (FLONASE) 50 MCG/ACT nasal spray Place 2 sprays into the nose daily. 2 sprays to each nostril at bedtime. 16 g 2  . hydrOXYzine (ATARAX/VISTARIL) 25 MG tablet Take 25 mg by mouth 3 (three) times daily as needed for anxiety.    LORazepam (ATIVAN) 2 MG tablet Take 2.5 mg by mouth every 4 (four) hours as needed for anxiety.     . mirtazapine (REMERON) 30 MG tablet Take 45 mg by mouth at bedtime. Mat give second dose if awake in night    . OLANZapine (ZYPREXA) 10 MG tablet Take 10 mg by mouth 2 (two) times daily. May take additional dose prn for agitation    . oxyCODONE-acetaminophen (PERCOCET) 5-325 MG per tablet Take 2 tablets by mouth every 4 (four) hours as needed for severe pain. (Patient not taking: Reported on 01/21/2020) 40 tablet 0    Musculoskeletal: Strength & Muscle Tone: within normal limits Gait & Station: normal Patient leans: N/A  Psychiatric Specialty Exam: Physical Exam  Constitutional: He is oriented to person, place, and time. He appears well-developed.  HENT:  Head: Normocephalic.  Eyes: Pupils are equal, round, and reactive to light.  Cardiovascular: Normal rate.  Respiratory: Effort normal.  Musculoskeletal:        General: Normal range of motion.     Cervical back: Normal range of motion.  Neurological: He is alert and oriented to person, place, and time.  Psychiatric: He has a normal mood and affect. His behavior is normal.    Review of Systems  Psychiatric/Behavioral: Negative for agitation, behavioral problems, confusion and self-injury. The patient is not nervous/anxious.     Blood pressure (!) 142/103, pulse 92, temperature 98.1 F (36.7 C), temperature source Axillary, resp. rate 20, SpO2 100 %.There is no height or weight on file to calculate BMI.  General Appearance: Casual  Eye Contact:  Good  Speech:  patient has limited speech as baseline  Volume:   Normal  Mood:  Euthymic and smiling during assessment  Affect:  Congruent  Thought Process:  NA  Orientation:  Other:  patient with Autism is limited verbally   Thought Content:  Logical and pt limited but offers responses to questions  Suicidal Thoughts:  No  Homicidal Thoughts:  No  Memory:  NA  Judgement:  Fair  Insight:  pt with autism and baseline impairment, unable to assess  Psychomotor Activity:  Normal  Concentration:  Concentration: Fair and Attention Span: Fair  Recall:  NA  Fund of Knowledge:  NA  Language:  baseline speech limitations  Akathisia:  Negative  Handed:  Right  AIMS (if indicated):     Assets:  Housing Resilience  ADL's:  Intact  Cognition:  Impaired,  Mild baseline with autism   Sleep:   <6 hours     Treatment Plan Summary: Recommend discharge home to follow-up with Dr.Akintayo outpatient psychiatrist Per notes, plan of care was discussed with Dr. Marland Kitchen and no further medication changes  were recommended.  SW to contact patient's guardian to discuss discharge recommendations.   Disposition: No evidence of imminent risk to self or others at present.   Patient does not meet criteria for psychiatric inpatient admission. Supportive therapy provided about ongoing stressors. Discussed crisis plan, support from social network, calling 911, coming to the Emergency Department, and calling Suicide Hotline.  This service was provided via telemedicine using a 2-way, interactive audio and video technology.  Names of all persons participating in this telemedicine service and their role in this encounter. Name: Jonathan Bird Role: Patient  Name: Merlyn Lot Role: Constantine  Name: Hampton Abbot Role: Psychiatrist   Spoke with Dr. Barbette Merino EDP informed of above recommendation and disposition  Mallie Darting, NP 01/22/2020 12:17 PM

## 2020-01-22 NOTE — ED Notes (Signed)
Pt dc'd to care of guardian.  Walked out with NT.  Stable and cooperative at time of dc.

## 2020-01-22 NOTE — ED Notes (Addendum)
Seizure pads applied to patient bed, so pt do not hurt his self

## 2020-01-22 NOTE — Progress Notes (Signed)
I spoke with Mr. Jonathan Bird and his QP Jonathan Bird.  Both were informed that the patient was psych cleared and discharged to follow-up with community resources.  Mr. Jonathan Bird verbalized his disappointment as he anticipated getting "some rest" prior to picking the patient up from the hospital. Most of his concerns were behavioral and dominated by caregiver strain.  I validated his concerns with addressing the patient's med/psychiatric chronicity.  Also expressed my intent as a health care provider to build a patient-care giver alliance in which he feels supported and empowered to reach out like today to discuss concerns. We reviewed criteria for inpatient admission and safety concerns warranting further evaluation.  Of course, I informed him, he should feel free to return for evaluation for any safety concerns.   I ensured all questions and concerns were answered.    Both participants thanked Conservation officer, nature for the call. [name] states he will pick the patient up in the next hour.     I asked SW to provide additional resources regarding respite care; and care giver training for behavior modification or other resources aimed at providing tools for long term management.

## 2020-01-25 ENCOUNTER — Other Ambulatory Visit: Payer: Self-pay

## 2020-01-25 ENCOUNTER — Emergency Department (HOSPITAL_COMMUNITY)
Admission: EM | Admit: 2020-01-25 | Discharge: 2020-02-16 | Disposition: A | Discharge status: Other Acute Inpatient Hospital | Payer: Medicare Other | Attending: Emergency Medicine | Admitting: Emergency Medicine

## 2020-01-25 ENCOUNTER — Encounter (HOSPITAL_COMMUNITY): Payer: Self-pay

## 2020-01-25 DIAGNOSIS — Z79899 Other long term (current) drug therapy: Secondary | ICD-10-CM | POA: Insufficient documentation

## 2020-01-25 DIAGNOSIS — Q860 Fetal alcohol syndrome (dysmorphic): Secondary | ICD-10-CM

## 2020-01-25 DIAGNOSIS — Y999 Unspecified external cause status: Secondary | ICD-10-CM | POA: Insufficient documentation

## 2020-01-25 DIAGNOSIS — Z20822 Contact with and (suspected) exposure to covid-19: Secondary | ICD-10-CM | POA: Diagnosis not present

## 2020-01-25 DIAGNOSIS — Y92009 Unspecified place in unspecified non-institutional (private) residence as the place of occurrence of the external cause: Secondary | ICD-10-CM | POA: Diagnosis not present

## 2020-01-25 DIAGNOSIS — F79 Unspecified intellectual disabilities: Secondary | ICD-10-CM | POA: Diagnosis not present

## 2020-01-25 DIAGNOSIS — F319 Bipolar disorder, unspecified: Secondary | ICD-10-CM | POA: Diagnosis not present

## 2020-01-25 DIAGNOSIS — Y9389 Activity, other specified: Secondary | ICD-10-CM | POA: Diagnosis not present

## 2020-01-25 DIAGNOSIS — R456 Violent behavior: Secondary | ICD-10-CM | POA: Diagnosis not present

## 2020-01-25 DIAGNOSIS — I10 Essential (primary) hypertension: Secondary | ICD-10-CM | POA: Diagnosis not present

## 2020-01-25 DIAGNOSIS — R4689 Other symptoms and signs involving appearance and behavior: Secondary | ICD-10-CM | POA: Diagnosis not present

## 2020-01-25 DIAGNOSIS — S0101XA Laceration without foreign body of scalp, initial encounter: Secondary | ICD-10-CM | POA: Diagnosis not present

## 2020-01-25 DIAGNOSIS — X789XXA Intentional self-harm by unspecified sharp object, initial encounter: Secondary | ICD-10-CM | POA: Insufficient documentation

## 2020-01-25 DIAGNOSIS — F840 Autistic disorder: Secondary | ICD-10-CM | POA: Diagnosis not present

## 2020-01-25 DIAGNOSIS — R451 Restlessness and agitation: Secondary | ICD-10-CM | POA: Diagnosis not present

## 2020-01-25 DIAGNOSIS — R454 Irritability and anger: Secondary | ICD-10-CM | POA: Diagnosis not present

## 2020-01-25 LAB — CBC WITH DIFFERENTIAL/PLATELET
Abs Immature Granulocytes: 0.02 10*3/uL (ref 0.00–0.07)
Basophils Absolute: 0 10*3/uL (ref 0.0–0.1)
Basophils Relative: 0 %
Eosinophils Absolute: 0.1 10*3/uL (ref 0.0–0.5)
Eosinophils Relative: 2 %
HCT: 47.7 % (ref 39.0–52.0)
Hemoglobin: 15.3 g/dL (ref 13.0–17.0)
Immature Granulocytes: 0 %
Lymphocytes Relative: 23 %
Lymphs Abs: 1.7 10*3/uL (ref 0.7–4.0)
MCH: 26.6 pg (ref 26.0–34.0)
MCHC: 32.1 g/dL (ref 30.0–36.0)
MCV: 82.8 fL (ref 80.0–100.0)
Monocytes Absolute: 0.6 10*3/uL (ref 0.1–1.0)
Monocytes Relative: 8 %
Neutro Abs: 4.9 10*3/uL (ref 1.7–7.7)
Neutrophils Relative %: 67 %
Platelets: 219 10*3/uL (ref 150–400)
RBC: 5.76 MIL/uL (ref 4.22–5.81)
RDW: 13.9 % (ref 11.5–15.5)
WBC: 7.3 10*3/uL (ref 4.0–10.5)
nRBC: 0 % (ref 0.0–0.2)

## 2020-01-25 LAB — COMPREHENSIVE METABOLIC PANEL
ALT: 30 U/L (ref 0–44)
AST: 28 U/L (ref 15–41)
Albumin: 4.5 g/dL (ref 3.5–5.0)
Alkaline Phosphatase: 116 U/L (ref 38–126)
Anion gap: 11 (ref 5–15)
BUN: 16 mg/dL (ref 6–20)
CO2: 26 mmol/L (ref 22–32)
Calcium: 9.2 mg/dL (ref 8.9–10.3)
Chloride: 101 mmol/L (ref 98–111)
Creatinine, Ser: 0.92 mg/dL (ref 0.61–1.24)
GFR calc Af Amer: >60 mL/min (ref >60)
GFR calc non Af Amer: >60 mL/min (ref >60)
Glucose, Bld: 92 mg/dL (ref 70–99)
Potassium: 3.7 mmol/L (ref 3.5–5.1)
Sodium: 138 mmol/L (ref 135–145)
Total Bilirubin: 0.7 mg/dL (ref 0.3–1.2)
Total Protein: 8 g/dL (ref 6.5–8.1)

## 2020-01-25 LAB — RESPIRATORY PANEL BY RT PCR (FLU A&B, COVID)
Influenza A by PCR: NEGATIVE
Influenza B by PCR: NEGATIVE
SARS Coronavirus 2 by RT PCR: NEGATIVE

## 2020-01-25 LAB — ETHANOL: Alcohol, Ethyl (B): <10 mg/dL (ref <10)

## 2020-01-25 MED ORDER — CHLORPROMAZINE HCL 25 MG PO TABS
100.0000 mg | ORAL_TABLET | Freq: Three times a day (TID) | ORAL | Status: DC
  Administered 2020-01-25 – 2020-02-16 (×62): 100 mg via ORAL
  Filled 2020-01-25 (×61): qty 4

## 2020-01-25 MED ORDER — FLUTICASONE PROPIONATE 50 MCG/ACT NA SUSP: 2.0000 | Freq: Every day | NASAL | Status: DC

## 2020-01-25 MED ORDER — AMANTADINE HCL 100 MG PO CAPS
100.0000 mg | ORAL_CAPSULE | Freq: Two times a day (BID) | ORAL | Status: DC
  Administered 2020-01-25 – 2020-02-16 (×43): 100 mg via ORAL
  Filled 2020-01-25 (×43): qty 1

## 2020-01-25 MED ORDER — MIRTAZAPINE 7.5 MG PO TABS
45.0000 mg | ORAL_TABLET | Freq: Every day | ORAL | Status: DC
  Administered 2020-01-25 – 2020-02-15 (×19): 45 mg via ORAL
  Filled 2020-01-25 (×22): qty 1

## 2020-01-25 MED ORDER — ZIPRASIDONE MESYLATE 20 MG IM SOLR
20.0000 mg | Freq: Once | INTRAMUSCULAR | Status: AC
  Administered 2020-01-25: 20 mg via INTRAMUSCULAR

## 2020-01-25 MED ORDER — ZIPRASIDONE MESYLATE 20 MG IM SOLR
INTRAMUSCULAR | Status: AC
  Filled 2020-01-25: qty 20

## 2020-01-25 MED ORDER — LORAZEPAM 1 MG PO TABS
2.5000 mg | ORAL_TABLET | ORAL | Status: DC | PRN
  Administered 2020-01-25 – 2020-02-15 (×53): 2.5 mg via ORAL
  Filled 2020-01-25 (×55): qty 1

## 2020-01-25 MED ORDER — STERILE WATER FOR INJECTION IJ SOLN
INTRAMUSCULAR | Status: AC
  Filled 2020-01-25: qty 10

## 2020-01-25 MED ORDER — CARBAMAZEPINE 200 MG PO TABS
200.0000 mg | ORAL_TABLET | Freq: Two times a day (BID) | ORAL | Status: DC
  Administered 2020-01-25 – 2020-01-26 (×3): 200 mg via ORAL
  Filled 2020-01-25 (×3): qty 1

## 2020-01-25 MED ORDER — ZIPRASIDONE MESYLATE 20 MG IM SOLR
INTRAMUSCULAR | Status: AC
  Administered 2020-01-25: 20 mg
  Filled 2020-01-25: qty 20

## 2020-01-25 MED ORDER — OLANZAPINE 10 MG PO TABS: 10.0000 mg | ORAL_TABLET | Freq: Two times a day (BID) | ORAL | Status: DC

## 2020-01-25 MED ORDER — LORATADINE 10 MG PO TABS
10.0000 mg | ORAL_TABLET | Freq: Every day | ORAL | Status: DC
  Administered 2020-01-25 – 2020-02-16 (×22): 10 mg via ORAL
  Filled 2020-01-25 (×22): qty 1

## 2020-01-25 MED ORDER — DIVALPROEX SODIUM ER 250 MG PO TB24: 250.0000 mg | ORAL_TABLET | ORAL | Status: DC

## 2020-01-25 MED ORDER — LIDOCAINE-EPINEPHRINE-TETRACAINE (LET) TOPICAL GEL
3.0000 mL | Freq: Once | TOPICAL | Status: DC
  Filled 2020-01-25: qty 3

## 2020-01-25 NOTE — BH Assessment (Signed)
Clinician spoke to pt's legal guardian, Jonathan Bird, at [phone] for collateral, which was beneficial in determining a disposition for pt. The information gathered in that phone call can be found in [name], dated and time-stamped 2/14 5:43 PM. The following information re: pt's key players were also obtained in that phone call:  Deland Pretty, QP - Community Support Services: [phone] Kandra Nicolas, Care Coordinator [facility]: [phone] Jonathan Bird, Legal Guardian: [phone]

## 2020-01-25 NOTE — ED Notes (Signed)
Pt became agitated and began having violent outbursts, striking the bed with hands and feet, attempting to reach for and throw equipment and striking out at staff, Pt was placed in restraints, hands and feet for the safety of staff and Pt.

## 2020-01-25 NOTE — ED Triage Notes (Signed)
EMS reports from home accompanied by [employer] caregiver called for for assistance with Pt, became upset and began breaking things around the house, broke an object and began stabbing himself in the head. Pt presents with superficial lacerations to head. PT autistic and has behavioral history, and Hx of fetal alcohol syndrome.  BP 148/92 HR 110 RR 18 Sp02 96 RA CBG 113 Temp 97.3

## 2020-01-25 NOTE — ED Provider Notes (Signed)
[place] DEPT Provider Note   CSN: [id number] Arrival date & time: 01/25/20  1351     History Chief Complaint  Patient presents with  . Behavioral episode  . Autistic    EMMITTE SURGEON is a 29 y.o. male.  The history is provided by the EMS personnel, medical records and a caregiver. The history is limited by a developmental delay. No language interpreter was used.     29 year old male with significant history of low function autism, bipolar, diabetes, hypertension, history of recurrent anger outburst brought here via [employer] from home for aggression.  EMS report that caregiver call for assistant because patient became upset and began breaking things around the house as well as breaking object and stabbing himself in the head.  He has history of fetal alcohol syndrome.  He was last released from the hospital 3 days ago for similar emotional outburst.  History limited due to his baseline mental health.  Past Medical History:  Diagnosis Date  . Autism    outbursts  . Autism spectrum   . Bipolar disorder ([place])   . Diabetes mellitus without complication ([place])    boarderline  . Hypertension     Patient Active Problem List   Diagnosis Date Noted  . Autism disorder 01/22/2020  . Outbursts of anger 01/22/2020    Past Surgical History:  Procedure Laterality Date  . NO PAST SURGERIES    . TOOTH EXTRACTION N/A 05/25/2014   Procedure: EXTRACTION WISDOM TEETH, Extraction teeth # 1, 16, 17, 32.;  Surgeon: Gae Bon, DDS;  Location: [facility];  Service: Oral Surgery;  Laterality: N/A;       History reviewed. No pertinent family history.  Social History   Tobacco Use  . Smoking status: Never Smoker  . Smokeless tobacco: Never Used  Substance Use Topics  . Alcohol use: No  . Drug use: No    Home Medications Prior to Admission medications   Medication Sig Start Date End Date Taking? Authorizing Provider  amantadine (SYMMETREL) 100 MG capsule  Take 100 mg by mouth 2 (two) times daily. 12/09/19   [provider]  carbamazepine (TEGRETOL) 200 MG tablet Take 200 mg by mouth 2 (two) times daily. 01/06/20   [provider]  cetirizine (ZYRTEC) 10 MG tablet Take 10 mg by mouth daily.    [provider]  chlorproMAZINE (THORAZINE) 100 MG tablet Take 100 mg by mouth 3 (three) times daily. 01/08/20   [provider]  divalproex (DEPAKOTE ER) 250 MG 24 hr tablet Take 250-500 mg by mouth See admin instructions. Take 250mg  in AM & 500mg  in evening    [provider]  fluticasone (FLONASE) 50 MCG/ACT nasal spray Place 2 sprays into the nose daily. 2 sprays to each nostril at bedtime. 10/02/12   Moreno-Coll, Adlih, MD  hydrOXYzine (ATARAX/VISTARIL) 25 MG tablet Take 25 mg by mouth 3 (three) times daily as needed for anxiety. 01/08/20   [provider]  LORazepam (ATIVAN) 2 MG tablet Take 2.5 mg by mouth every 4 (four) hours as needed for anxiety.  01/06/20   [provider]  mirtazapine (REMERON) 30 MG tablet Take 45 mg by mouth at bedtime. Mat give second dose if awake in night 01/06/20   [provider]  OLANZapine (ZYPREXA) 10 MG tablet Take 10 mg by mouth 2 (two) times daily. May take additional dose prn for agitation    [provider]  oxyCODONE-acetaminophen (PERCOCET) 5-325 MG per tablet Take  2 tablets by mouth every 4 (four) hours as needed for severe pain. Patient not taking: Reported on 01/21/2020 05/25/14   Ocie Doyne, DDS    Allergies    Patient has no known allergies.  Review of Systems   Review of Systems  Unable to perform ROS: Psychiatric disorder    Physical Exam Updated Vital Signs BP (!) 141/82   Pulse (!) 104   Temp 97.9 F (36.6 C) (Oral)   Resp 18   SpO2 100%   Physical Exam Vitals and nursing note reviewed.  Constitutional:      Appearance: He is well-developed.     Comments: Patient actively screaming, yelling, throwing things,  kicking the bed appears to be very upset.  HENT:     Head:     Comments: 3 cm superficial laceration noted to left parietal scalp without any foreign body noted not actively bleeding and no crepitus. Eyes:     Conjunctiva/sclera: Conjunctivae normal.  Cardiovascular:     Rate and Rhythm: Tachycardia present.     Pulses: Normal pulses.     Heart sounds: Normal heart sounds.  Pulmonary:     Effort: Pulmonary effort is normal.     Breath sounds: Normal breath sounds.  Abdominal:     Palpations: Abdomen is soft.  Musculoskeletal:     Cervical back: Neck supple.     Comments: Abrasions noted to bilateral hands and right anterior mid shin.  No crepitus and no obvious deformity.  Skin:    Findings: No rash.  Neurological:     Mental Status: He is alert.     GCS: GCS eye subscore is 4. GCS verbal subscore is 2. GCS motor subscore is 6.  Psychiatric:        Mood and Affect: Affect is labile and angry.        Speech: He is noncommunicative.        Behavior: Behavior is aggressive and combative.        Cognition and Memory: Cognition is impaired.        Judgment: Judgment is impulsive.     ED Results / Procedures / Treatments   Labs (all labs ordered are listed, but only abnormal results are displayed) Labs Reviewed  RESPIRATORY PANEL BY RT PCR (FLU A&B, COVID)  CBC WITH DIFFERENTIAL/PLATELET  COMPREHENSIVE METABOLIC PANEL  ETHANOL  RAPID URINE DRUG SCREEN, HOSP PERFORMED    EKG None  Radiology No results found.  Procedures .Marland KitchenLaceration Repair  Date/Time: 01/25/2020 5:12 PM Performed by: Fayrene Helper, PA-C Authorized by: Fayrene Helper, PA-C   Consent:    Consent obtained:  Verbal   Consent given by:  Patient   Risks discussed:  Infection, need for additional repair, pain, poor cosmetic result and poor wound healing   Alternatives discussed:  No treatment and delayed treatment Universal protocol:    Procedure explained and questions answered to patient or proxy's  satisfaction: yes     Relevant documents present and verified: yes     Test results available and properly labeled: yes     Imaging studies available: yes     Required blood products, implants, devices, and special equipment available: yes     Site/side marked: yes     Immediately prior to procedure, a time out was called: yes     Patient identity confirmed:  Verbally with patient Anesthesia (see MAR for exact dosages):    Anesthesia method:  Topical application   Topical anesthetic:  LET Laceration details:  Location:  Scalp   Scalp location:  L parietal   Length (cm):  3   Depth (mm):  3 Repair type:    Repair type:  Simple Pre-procedure details:    Preparation:  Patient was prepped and draped in usual sterile fashion Exploration:    Hemostasis achieved with:  LET   Wound exploration: wound explored through full range of motion     Contaminated: no   Treatment:    Area cleansed with:  Saline   Amount of cleaning:  Standard   Irrigation solution:  Sterile saline Skin repair:    Repair method:  Staples   Number of staples:  2 Approximation:    Approximation:  Close Post-procedure details:    Dressing:  Antibiotic ointment   Patient tolerance of procedure:  Tolerated well, no immediate complications   (including critical care time)  Medications Ordered in ED Medications  ziprasidone (GEODON) 20 MG injection (has no administration in time range)  lidocaine-EPINEPHrine-tetracaine (LET) topical gel (has no administration in time range)  amantadine (SYMMETREL) capsule 100 mg (100 mg Oral Given 01/25/20 1601)  carbamazepine (TEGRETOL) tablet 200 mg (200 mg Oral Given 01/25/20 1601)  chlorproMAZINE (THORAZINE) tablet 100 mg (100 mg Oral Given 01/25/20 1601)  loratadine (CLARITIN) tablet 10 mg (10 mg Oral Given 01/25/20 1601)  LORazepam (ATIVAN) tablet 2.5 mg (has no administration in time range)  mirtazapine (REMERON) tablet 45 mg (has no administration in time range)    ziprasidone (GEODON) injection 20 mg (20 mg Intramuscular Given 01/25/20 1452)    ED Course  I have reviewed the triage vital signs and the nursing notes.  Pertinent labs & imaging results that were available during my care of the patient were reviewed by me and considered in my medical decision making (see chart for details).    MDM Rules/Calculators/A&P                      BP 140/90 (BP Location: Right Arm)   Pulse 92   Temp 97.9 F (36.6 C) (Oral)   Resp 18   SpO2 100%   Final Clinical Impression(s) / ED Diagnoses Final diagnoses:  Aggression  Scalp laceration, initial encounter    Rx / DC Orders ED Discharge Orders    None     2:30 PM Patient with significant psychiatric history which includes aggressive behavior and emotional outburst who is mostly nonverbal due to his low functioning autism brought here due to emotional outburst.  Patient was found to be screaming yelling and striking his back and throwing things.  On exam he has a laceration to the left parietal scalp it appears to be superficial.  Multiple abrasions noted to bilateral hands, abrasion noted to right anterior lower leg but no obvious deformity noted.  Security was available due to his emotional outburst, will give Geodon as well as soft physical restraints in order to fully assess patient.  5:13 PM Patient is currently resting comfortably.  I was able to perform laceration repair on his left parietal scalp with surgical staples.  The staples will need to be removed in 5 to 7 days.  Patient is medically clear and can further psychiatrically assessed by TTS.   Fayrene Helper, PA-C 01/25/20 2024    Linwood Dibbles, MD 01/27/20 1043
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
## 2020-01-26 ENCOUNTER — Encounter (HOSPITAL_COMMUNITY): Payer: Self-pay | Admitting: Registered Nurse

## 2020-01-26 DIAGNOSIS — R456 Violent behavior: Secondary | ICD-10-CM

## 2020-01-26 DIAGNOSIS — F840 Autistic disorder: Secondary | ICD-10-CM | POA: Diagnosis not present

## 2020-01-26 DIAGNOSIS — R454 Irritability and anger: Secondary | ICD-10-CM

## 2020-01-26 LAB — RAPID URINE DRUG SCREEN, HOSP PERFORMED
Amphetamines: NOT DETECTED
Barbiturates: NOT DETECTED
Benzodiazepines: POSITIVE — AB
Cocaine: NOT DETECTED
Opiates: NOT DETECTED
Tetrahydrocannabinol: NOT DETECTED

## 2020-01-26 LAB — CARBAMAZEPINE LEVEL, TOTAL: Carbamazepine Lvl: 7.6 ug/mL (ref 4.0–12.0)

## 2020-01-26 MED ORDER — CARBAMAZEPINE 100 MG PO CHEW
200.0000 mg | CHEWABLE_TABLET | Freq: Two times a day (BID) | ORAL | Status: DC
  Administered 2020-01-26 – 2020-02-16 (×40): 200 mg via ORAL
  Filled 2020-01-26 (×45): qty 2

## 2020-01-26 MED ORDER — ZIPRASIDONE MESYLATE 20 MG IM SOLR
10.0000 mg | Freq: Once | INTRAMUSCULAR | Status: AC
  Administered 2020-01-26: 10 mg via INTRAMUSCULAR

## 2020-01-26 MED ORDER — ZIPRASIDONE MESYLATE 20 MG IM SOLR: 10.0000 mg | Freq: Once | INTRAMUSCULAR | Status: DC

## 2020-01-26 MED ORDER — ZIPRASIDONE MESYLATE 20 MG IM SOLR
10.0000 mg | Freq: Once | INTRAMUSCULAR | Status: AC | PRN
  Administered 2020-01-27: 06:00:00 10 mg via INTRAMUSCULAR
  Filled 2020-01-26: qty 20

## 2020-01-26 MED ORDER — STERILE WATER FOR INJECTION IJ SOLN
INTRAMUSCULAR | Status: AC
  Administered 2020-01-26: 10 mL
  Filled 2020-01-26: qty 10

## 2020-01-26 MED ORDER — CHLORPROMAZINE HCL 25 MG/ML IJ SOLN
25.0000 mg | Freq: Three times a day (TID) | INTRAMUSCULAR | Status: DC | PRN
  Administered 2020-01-26 – 2020-02-16 (×19): 25 mg via INTRAMUSCULAR
  Filled 2020-01-26 (×22): qty 1

## 2020-01-26 MED ORDER — ZIPRASIDONE MESYLATE 20 MG IM SOLR
INTRAMUSCULAR | Status: AC
  Filled 2020-01-26: qty 20

## 2020-01-26 MED ORDER — ZIPRASIDONE MESYLATE 20 MG IM SOLR
20.0000 mg | Freq: Once | INTRAMUSCULAR | Status: AC
  Administered 2020-01-26: 20 mg via INTRAMUSCULAR
  Filled 2020-01-26: qty 20

## 2020-01-26 NOTE — BH Assessment (Signed)
[facility] Assessment Progress Note  Per Shuvon Rankin, FNP, this voluntary pt requires psychiatric hospitalization at this time.  Placement will be sought for pt.  Pt's nurse, Addison Naegeli, has been notified.  She reports receiving a call from pt's [facility], [name] [phone]).  [name] reports that pt's legal guardian is named Danelle Earthly [phone]).  Melynda Ripple has been informed and asked to contact these parties to notify them of pt's disposition.  Doylene Canning, [place] [facility] Coordinator [phone]

## 2020-01-26 NOTE — ED Provider Notes (Signed)
Patient with agitated behavior earlier, thrashing about on stretcher, biting at restraints. Attempted to calm, remains agitated, aggressive/violent behavior.   Geodon 10 im (prior ecg w normal qt).   Restraint ordered for patient and staff safety.   Psychiatry team consulted for med management/adjustment as per rn staff, pt with episodic/recurrent agitation and aggressive behavior.   Recheck pt, calmer, alert. No distress.      Cathren Laine, MD 01/26/20 1606

## 2020-01-26 NOTE — Progress Notes (Signed)
01/26/2020  1153  Dark Green tube sent to main lab.

## 2020-01-26 NOTE — Progress Notes (Signed)
01/26/2020  Patient yelling, spitting, and shaking the bed. Notified MD for orders.
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
## 2020-01-26 NOTE — ED Provider Notes (Signed)
  Physical Exam  BP (!) 143/92 (BP Location: Left Arm)   Pulse (!) 113   Temp 97.7 F (36.5 C) (Oral)   Resp (!) 22   SpO2 99%   Physical Exam  ED Course/Procedures     Procedures  MDM   Despite oral thorazine patient still combative. He is spitting and trying to get out of the bed. IM thorazine given and slight improvement seen only. Pt is still trying to get out of the bed. Violent restrain orders.  As needed Geodon ordered. We will continue to monitor him.  Repeat EKG also ordered to ensure there is no worsening of QT prolongation.  CRITICAL CARE Performed by: Karrin Eisenmenger   Total critical care time: 38 minutes  Critical care time was exclusive of separately billable procedures and treating other patients.  Critical care was necessary to treat or prevent imminent or life-threatening deterioration.  Critical care was time spent personally by me on the following activities: development of treatment plan with patient and/or surrogate as well as nursing, discussions with consultants, evaluation of patient's response to treatment, examination of patient, obtaining history from patient or surrogate, ordering and performing treatments and interventions, ordering and review of laboratory studies, ordering and review of radiographic studies, pulse oximetry and re-evaluation of patient's condition.   EKG Interpretation  Date/Time:  Monday January 26 2020 12:05:50 EST Ventricular Rate:  110 PR Interval:  166 QRS Duration: 96 QT Interval:  348 QTC Calculation: 470 R Axis:   92 Text Interpretation: Sinus tachycardia Rightward axis Borderline ECG No acute changes No significant change since last tracing Confirmed by Derwood Kaplan [phone]) on 01/26/2020 11:29:47 PM       Reassessment: Per nursing staff, patient has become more cooperative.  Reassessment: Psych team requested we involuntarily commit the patient as he will need to be transferred to [facility]. I will complete  the IVC paperwork. Labs reviewed, they are reassuring.  He remains slightly tachycardic.  No QT prolongation.   Derwood Kaplan, MD 01/26/20 2330
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
## 2020-01-26 NOTE — Progress Notes (Signed)
01/26/2020  1610  Received call from [name] (care Coordinator) [phone], she stated the patient has a legal guardian named Danelle Earthly [phone].

## 2020-01-26 NOTE — BH Assessment (Signed)
Patient evaluated by Assunta Found, NP and inpatient treatment was recommended. Counselor contacted Adonis Huguenin, Legal Guardian: [phone] to discuss recommendations. Also, to provide information about patient's disposition. The guardian did not answer the phone. His voicemail is full and counselor unable to leave voicemail. Second attempt completed and counselor was unable to reach guardian.   Contacted Carry [employer], Care Coordinator [facility]: [phone]. She stated that patient's guardian will not allow patient to return to the home. The guardian does not feel safe with patient in the home at this point. Care Coordinator is seeking group home placement for patient. States she has referred him out to 8 group homes today. No responses at the time.   Meanwhile, counselor discussed with Care Coordinator plans to refer patient to [facility] (Diversion referral). Care Coordinator agreed as she will continue to find placement for patient.   Counselor completed the Diversion referral form and [facility] referral form. Faxed referrals to [facility]. [facility] phone referral was completed.  Counselor contacted [facility] and patient was placed on their wait list.   Counselor contacted [facility] to obtain a Diversion authorization number. Faxed the appropriate documents to [facility] for their review and approve the Diversion authorization number.  [facility] will fax and email a copy of the completed authorization to this counselor once it's reviewed and completed. The Diversion Authorization # was not received by the end of my my shift. TTS staff/LCSW will need to follow up accordingly by faxing the approved authorizations to [facility].   Patient is also voluntary at this time. Counselor contacted EDP (Dr. Shyrl Numbers) and he agreed to completed the IVC papers for patient's referral to [facility]. Those papers were not served by the end of my shift. Therefore, counselor asked patient's nurse Randa Evens) to fax the papers once  services.

## 2020-01-27 ENCOUNTER — Encounter (HOSPITAL_COMMUNITY): Payer: Self-pay | Admitting: Registered Nurse

## 2020-01-27 DIAGNOSIS — F840 Autistic disorder: Secondary | ICD-10-CM | POA: Diagnosis not present

## 2020-01-27 MED ORDER — STERILE WATER FOR INJECTION IJ SOLN
INTRAMUSCULAR | Status: AC
  Filled 2020-01-27: qty 10

## 2020-01-27 MED ORDER — HALOPERIDOL LACTATE 5 MG/ML IJ SOLN
10.0000 mg | Freq: Once | INTRAMUSCULAR | Status: AC
  Administered 2020-01-28: 14:00:00 10 mg via INTRAMUSCULAR
  Filled 2020-01-27: qty 2

## 2020-01-27 MED ORDER — ZIPRASIDONE MESYLATE 20 MG IM SOLR
20.0000 mg | Freq: Once | INTRAMUSCULAR | Status: AC
  Administered 2020-01-27: 20 mg via INTRAMUSCULAR
  Filled 2020-01-27: qty 20

## 2020-01-27 MED ORDER — STERILE WATER FOR INJECTION IJ SOLN
INTRAMUSCULAR | Status: AC
  Administered 2020-01-27: 10 mL
  Filled 2020-01-27: qty 10

## 2020-01-27 NOTE — Progress Notes (Addendum)
Received [place] at the change of shift thrashing about in his bed and making loud noises. Bed bumpers are in place along with a posey. Later he received fluids per his choice and he was compliant with his medications. He eventually drifted off to sleep and throughout the night. He woke up and immediately started thrashing forcefully about in the bed. He was medicated with Geodon 10 mg IM in his right deltoid.

## 2020-01-27 NOTE — Consult Note (Signed)
[facility] ED Progress Note  01/27/2020 12:26 PM Jonathan Bird  MRN:  [id number]   Subjective:  Jonathan Bird, 29 y.o., male patient seen via tele psych by this provider, Dr. Lucianne Muss; and chart reviewed on 01/27/20.  On evaluation Jonathan Bird is coming from bathroom.  Patient in a pleasant mood.  When asked how he is doing patient responded "Doing good"  States he ate "waffle and pancake" for breakfast and that he lives on "[address]"  Articulation is difficult to understand. During assessment patient started dancing, singing.  Patient informed to behave and not to hit anyone he responded "Behave, no hitting, yes ma'am.  Thank you."  Medication adjustments made yesterday.  Patient has been out of restraints since &;20 am.  Appears to be in a better mood today calm/cooperative.   Addendum: Patient with history of Autism, fetal alcohol, and intellectual disorder ED is not a good environment when wanting to walk around outside of room, redirection can be difficult cause behavioral reaction.  Patient medicated with Geodon around 3am, Ativan around 12 pm.  Currently patient agitated and Beth, RN called for medication informed that patient had Thorazine ordered prn for agitation.  Patient on multiple antipsychotics and   Principal Problem: Aggression Diagnosis:  Principal Problem:   Aggression Active Problems:   Autism disorder  Total Time spent with patient: 30 minutes  Past Psychiatric History: Autism spectrum disorder, fetal alcohol syndrome, intellectual disorder   Past Medical History:  Past Medical History:  Diagnosis Date  . Autism    outbursts  . Autism spectrum   . Bipolar disorder (HCC)   . Diabetes mellitus without complication (HCC)    boarderline  . Hypertension     Past Surgical History:  Procedure Laterality Date  . NO PAST SURGERIES    . TOOTH EXTRACTION N/A 05/25/2014   Procedure: EXTRACTION WISDOM TEETH, Extraction teeth # 1, 16, 17, 32.;  Surgeon: Georgia Lopes, DDS;   Location: [facility] OR;  Service: Oral Surgery;  Laterality: N/A;   Family History: History reviewed. No pertinent family history. Family Psychiatric  History: Unaware Social History:  Social History   Substance and Sexual Activity  Alcohol Use No     Social History   Substance and Sexual Activity  Drug Use No    Social History   Socioeconomic History  . Marital status: Single    Spouse name: Not on file  . Number of children: Not on file  . Years of education: Not on file  . Highest education level: Not on file  Occupational History  . Not on file  Tobacco Use  . Smoking status: Never Smoker  . Smokeless tobacco: Never Used  Substance and Sexual Activity  . Alcohol use: No  . Drug use: No  . Sexual activity: Never  Other Topics Concern  . Not on file  Social History Narrative  . Not on file   Social Determinants of Health   Financial Resource Strain:   . Difficulty of Paying Living Expenses: Not on file  Food Insecurity:   . Worried About Programme researcher, broadcasting/film/video in the Last Year: Not on file  . Ran Out of Food in the Last Year: Not on file  Transportation Needs:   . Lack of Transportation (Medical): Not on file  . Lack of Transportation (Non-Medical): Not on file  Physical Activity:   . Days of Exercise per Week: Not on file  . Minutes of Exercise per Session: Not on file  Stress:   . Feeling of Stress : Not on file  Social Connections:   . Frequency of Communication with Friends and Family: Not on file  . Frequency of Social Gatherings with Friends and Family: Not on file  . Attends Religious Services: Not on file  . Active Member of Clubs or Organizations: Not on file  . Attends Banker Meetings: Not on file  . Marital Status: Not on file    Sleep: Fair  Appetite:  Good  Current Medications: Current Facility-Administered Medications  Medication Dose Route Frequency Provider Last Rate Last Admin  . amantadine (SYMMETREL) capsule 100 mg  100 mg  Oral BID Fayrene Helper, PA-C   100 mg at 01/27/20 0865  . carbamazepine (TEGRETOL) chewable tablet 200 mg  200 mg Oral BID Addiel Mccardle B, NP   200 mg at 01/27/20 0921  . chlorproMAZINE (THORAZINE) injection 25 mg  25 mg Intramuscular TID PRN Tequila Rottmann B, NP   25 mg at 01/26/20 1855  . chlorproMAZINE (THORAZINE) tablet 100 mg  100 mg Oral TID Fayrene Helper, PA-C   100 mg at 01/27/20 7846  . lidocaine-EPINEPHrine-tetracaine (LET) topical gel  3 mL Topical Once Fayrene Helper, PA-C      . loratadine (CLARITIN) tablet 10 mg  10 mg Oral Daily Fayrene Helper, PA-C   10 mg at 01/27/20 9629  . LORazepam (ATIVAN) tablet 2.5 mg  2.5 mg Oral Q4H PRN Fayrene Helper, PA-C   2.5 mg at 01/27/20 1206  . mirtazapine (REMERON) tablet 45 mg  45 mg Oral QHS Fayrene Helper, PA-C   45 mg at 01/26/20 2154   Current Outpatient Medications  Medication Sig Dispense Refill  . amantadine (SYMMETREL) 100 MG capsule Take 100 mg by mouth 2 (two) times daily.    . carbamazepine (TEGRETOL) 200 MG tablet Take 200 mg by mouth 2 (two) times daily.    . cetirizine (ZYRTEC) 10 MG tablet Take 10 mg by mouth daily.    . chlorproMAZINE (THORAZINE) 100 MG tablet Take 100 mg by mouth 3 (three) times daily.    . hydrOXYzine (ATARAX/VISTARIL) 25 MG tablet Take 25 mg by mouth 3 (three) times daily.     Marland Kitchen LORazepam (ATIVAN) 2 MG tablet Take 2.5 mg by mouth 2 (two) times daily.     . mirtazapine (REMERON) 30 MG tablet Take 45 mg by mouth at bedtime. Mat give second dose if awake in night    . fluticasone (FLONASE) 50 MCG/ACT nasal spray Place 2 sprays into the nose daily. 2 sprays to each nostril at bedtime. (Patient not taking: Reported on 01/25/2020) 16 g 2  . oxyCODONE-acetaminophen (PERCOCET) 5-325 MG per tablet Take 2 tablets by mouth every 4 (four) hours as needed for severe pain. (Patient not taking: Reported on 01/21/2020) 40 tablet 0    Lab Results:  Results for orders placed or performed during the hospital encounter of 01/25/20 (from the  past 48 hour(s))  Respiratory Panel by RT PCR (Flu A&B, Covid) - Nasopharyngeal Swab     Status: None   Collection Time: 01/25/20  2:25 PM   Specimen: Nasopharyngeal Swab  Result Value Ref Range   SARS Coronavirus 2 by RT PCR NEGATIVE NEGATIVE    Comment: (NOTE) SARS-CoV-2 target nucleic acids are NOT DETECTED. The SARS-CoV-2 RNA is generally detectable in upper respiratoy specimens during the acute phase of infection. The lowest concentration of SARS-CoV-2 viral copies this assay can detect is 131 copies/mL. A negative result does not preclude  SARS-Cov-2 infection and should not be used as the sole basis for treatment or other patient management decisions. A negative result may occur with  improper specimen collection/handling, submission of specimen other than nasopharyngeal swab, presence of viral mutation(s) within the areas targeted by this assay, and inadequate number of viral copies (<131 copies/mL). A negative result must be combined with clinical observations, patient history, and epidemiological information. The expected result is Negative. Fact Sheet for Patients:  https://www.moore.com/ Fact Sheet for Healthcare Providers:  https://www.young.biz/ This test is not yet ap proved or cleared by the [place] FDA and  has been authorized for detection and/or diagnosis of SARS-CoV-2 by FDA under an Emergency Use Authorization (EUA). This EUA will remain  in effect (meaning this test can be used) for the duration of the COVID-19 declaration under Section 564(b)(1) of the Act, 21 U.S.C. section 360bbb-3(b)(1), unless the authorization is terminated or revoked sooner.    Influenza A by PCR NEGATIVE NEGATIVE   Influenza B by PCR NEGATIVE NEGATIVE    Comment: (NOTE) The Xpert Xpress SARS-CoV-2/FLU/RSV assay is intended as an aid in  the diagnosis of influenza from Nasopharyngeal swab specimens and  should not be used as a sole basis for  treatment. Nasal washings and  aspirates are unacceptable for Xpert Xpress SARS-CoV-2/FLU/RSV  testing. Fact Sheet for Patients: https://www.moore.com/ Fact Sheet for Healthcare Providers: https://www.young.biz/ This test is not yet approved or cleared by the [place] FDA and  has been authorized for detection and/or diagnosis of SARS-CoV-2 by  FDA under an Emergency Use Authorization (EUA). This EUA will remain  in effect (meaning this test can be used) for the duration of the  Covid-19 declaration under Section 564(b)(1) of the Act, 21  U.S.C. section 360bbb-3(b)(1), unless the authorization is  terminated or revoked. Performed at [facility], [address] [place], [place] [address]   CBC with Differential/Platelet     Status: None   Collection Time: 01/25/20  2:25 PM  Result Value Ref Range   WBC 7.3 4.0 - 10.5 K/uL   RBC 5.76 4.22 - 5.81 MIL/uL   Hemoglobin 15.3 13.0 - 17.0 g/dL   HCT 96.2 22.9 - 79.8 %   MCV 82.8 80.0 - 100.0 fL   MCH 26.6 26.0 - 34.0 pg   MCHC 32.1 30.0 - 36.0 g/dL   RDW 92.1 19.4 - 17.4 %   Platelets 219 150 - 400 K/uL   nRBC 0.0 0.0 - 0.2 %   Neutrophils Relative % 67 %   Neutro Abs 4.9 1.7 - 7.7 K/uL   Lymphocytes Relative 23 %   Lymphs Abs 1.7 0.7 - 4.0 K/uL   Monocytes Relative 8 %   Monocytes Absolute 0.6 0.1 - 1.0 K/uL   Eosinophils Relative 2 %   Eosinophils Absolute 0.1 0.0 - 0.5 K/uL   Basophils Relative 0 %   Basophils Absolute 0.0 0.0 - 0.1 K/uL   Immature Granulocytes 0 %   Abs Immature Granulocytes 0.02 0.00 - 0.07 K/uL    Comment: Performed at [facility], [address], [place], [place] [address]  Comprehensive metabolic panel     Status: None   Collection Time: 01/25/20  2:25 PM  Result Value Ref Range   Sodium 138 135 - 145 mmol/L   Potassium 3.7 3.5 - 5.1 mmol/L   Chloride 101 98 - 111 mmol/L   CO2 26 22 - 32 mmol/L   Glucose, Bld 92 70 -  99 mg/dL  BUN 16 6 - 20 mg/dL   Creatinine, Ser 0.92 0.61 - 1.24 mg/dL   Calcium 9.2 8.9 - 10.3 mg/dL   Total Protein 8.0 6.5 - 8.1 g/dL   Albumin 4.5 3.5 - 5.0 g/dL   AST 28 15 - 41 U/L   ALT 30 0 - 44 U/L   Alkaline Phosphatase 116 38 - 126 U/L   Total Bilirubin 0.7 0.3 - 1.2 mg/dL   GFR calc non Af Amer >60 >60 mL/min   GFR calc Af Amer >60 >60 mL/min   Anion gap 11 5 - 15    Comment: Performed at [facility], [place] [address], [place], [place] [address]  Ethanol     Status: None   Collection Time: 01/25/20  2:25 PM  Result Value Ref Range   Alcohol, Ethyl (B) <10 <10 mg/dL    Comment: (NOTE) Lowest detectable limit for serum alcohol is 10 mg/dL. For medical purposes only. Performed at [facility], [place] [address] [place], [place] [address]   Rapid urine drug screen (hospital performed)     Status: Abnormal   Collection Time: 01/25/20  2:25 PM  Result Value Ref Range   Opiates NONE DETECTED NONE DETECTED   Cocaine NONE DETECTED NONE DETECTED   Benzodiazepines POSITIVE (A) NONE DETECTED   Amphetamines NONE DETECTED NONE DETECTED   Tetrahydrocannabinol NONE DETECTED NONE DETECTED   Barbiturates NONE DETECTED NONE DETECTED    Comment: (NOTE) DRUG SCREEN FOR MEDICAL PURPOSES ONLY.  IF CONFIRMATION IS NEEDED FOR ANY PURPOSE, NOTIFY LAB WITHIN 5 DAYS. LOWEST DETECTABLE LIMITS FOR URINE DRUG SCREEN Drug Class                     Cutoff (ng/mL) Amphetamine and metabolites    1000 Barbiturate and metabolites    200 Benzodiazepine                 716 Tricyclics and metabolites     300 Opiates and metabolites        300 Cocaine and metabolites        300 THC                            50 Performed at [facility], [place] [address], [place], [place] [address]   Carbamazepine level, total     Status: None   Collection Time: 01/26/20 11:50 AM  Result Value Ref Range   Carbamazepine Lvl 7.6 4.0 - 12.0 ug/mL    Comment:  Performed at [facility] [address] [place],  [address]    Blood Alcohol level:  Lab Results  Component Value Date   ETH <10 01/25/2020   ETH <10 01/21/2020    Physical Findings: AIMS:  , ,  ,  ,    CIWA:    COWS:     Musculoskeletal: Strength & Muscle Tone: within normal limits Gait & Station: normal Patient leans: N/A  Psychiatric Specialty Exam: Physical Exam Vitals and nursing note reviewed. Exam conducted with a chaperone present (Sitter at bed side).  Pulmonary:     Effort: Pulmonary effort is normal.  Musculoskeletal:        General: Normal range of motion.  Neurological:     Mental Status: He is alert.  Psychiatric:        Behavior: Behavior is cooperative.        Thought Content: Thought content  is not paranoid. Thought content does not include homicidal or suicidal ideation.        Cognition and Memory: Cognition is impaired. Memory is impaired.        Judgment: Judgment is impulsive.     Comments: Patient in pleasant mood, singing, dancing during assessment.  Autistic disorder with Intellectual disability.       Review of Systems  Unable to perform ROS: Other (Intellectual level)    Blood pressure 127/83, pulse (!) 101, temperature 98.5 F (36.9 C), temperature source Oral, resp. rate 20, SpO2 97 %.There is no height or weight on file to calculate BMI.  General Appearance: Casual  Eye Contact:  Good  Speech:  Garbled, Slow and Child like, difficult to understand  Volume:  Increased  Mood:  Pleasant, cooperative, calm  Affect:  Congruent  Thought Process:  Unable to assess  Orientation:  Other:  Person  Thought Content:  States "No" to hearing or seeing things that others can't  Suicidal Thoughts:  States "No" to wanting to hurt himself  Homicidal Thoughts:  States "No" to wanting to hurt other people  Memory:  Immediate;   Poor Recent;   Poor  Judgement:  Poor  Insight:  Unable to assess  Psychomotor Activity:  Active   Concentration:  Concentration: Poor and Attention Span: Poor  Recall:  Poor  Fund of Knowledge:  Poor  Language:  Poor  Akathisia:  No  Handed:  Right  AIMS (if indicated):     Assets:  Housing Social Support  ADL's:  Intact  Cognition:  Impaired,  Severe  Sleep:       Treatment Plan Summary: Daily contact with patient to assess and evaluate symptoms and progress in treatment and Medication management    Addendum 01/27/2020  On 01/27/2020 patient given  Ativan 2.5 mg IM at 1432 Geodon at 20 mg IM 1330  Thorazine 25 mg IM at 1315 Haldol 10 mg IM at 1315  Beth, RN states that the Haldol was not given and that she is going to report not given on the [facility].     Would stop Haldol and Geodon related to the increased dosage of Thorazine.  Also want to make sure keep an eye on QTc level (EKG)  Medication Management:  Carbamazepine Level:  7.6 Changed Tegretol (carbamazepine) to chewable tablets continued same dose of 200 mg Bid Continued Thorazine (chlorpromazine) 100 mg Tid Added Thorazine 25 mg Tid prn aggressive behavior:  Recommend keeping a close eye on QTc.  Ordered repeat of EKG for tomorrow morning.     Spoke with Dr. Adriana Simas, Dr. Lucianne Muss, and Waynetta Sandy, RN related to the medications given to patient for agitation from 13:15 to 1432.    Only the Thorazine PRN agitation, can continue the Ativan.  No other antipsychotics for agitation, aggressive behavior.   Monitor EKG daily   Godson Pollan, NP 01/27/2020, 12:26 PM

## 2020-01-27 NOTE — ED Notes (Signed)
Pt yelling loud, pacing, difficult to redirect  PRN Ativan 2.5 mg PO given.

## 2020-01-27 NOTE — ED Notes (Signed)
Pt restless, walking around room ,calling out, singing at times, pt in front of mirror calling out, dancing around. Pt needs continuous monitoring and redirection.

## 2020-01-27 NOTE — Progress Notes (Signed)
CSW spoke with pt's [facility] care coordinator, [name] [phone]) and informed her that [facility] had declined pt for admission. She agreed to meet with counselor Janice Coffin and other professional supports, including Ms Archer's supervisor, tomorrow morning. She will reach out to pt's supports and email Mr Kizzie Bane tomorrow morning with an exact time for the meeting.   Ms [name] also shared that pt's legal guardian was currently petitioning to relinquish guardianship rights. DSS will become pt's guardian when this process is completed.   She reports that Shelly Coss is actively looking for group home placement for pt.   Wells Guiles, LCSW, LCAS Disposition CSW [facility]/TTS [phone]

## 2020-01-27 NOTE — ED Notes (Signed)
Loud. Biting arm, redirected and reassured. Pt pacing in room.
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
## 2020-01-27 NOTE — Progress Notes (Signed)
Per [facility] in admissions at [facility], pt has been declined admission due to profound I/DD/IQ. Pt would be unable to program at their hospital.  Wells Guiles, LCSW, LCAS Disposition CSW [facility]/TTS [phone]

## 2020-01-27 NOTE — ED Notes (Signed)
Has been asleep since start of writers shift at 1900. He received medications prior to 1900 for his aggressive behavior which is the reason he is asleep now.  Sitter at doorway for his safety.
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
## 2020-01-27 NOTE — ED Notes (Signed)
Pt out of restraints at 720. Pt voided, changed , cooperative, in bed waiting for brk

## 2020-01-28 DIAGNOSIS — F840 Autistic disorder: Secondary | ICD-10-CM | POA: Diagnosis not present

## 2020-01-28 NOTE — ED Provider Notes (Signed)
Vitals:   01/27/20 1515 01/28/20 0600  BP: (!) 152/101 140/90  Pulse: (!) 110 (!) 115  Resp: 16 17  Temp:  98.7 F (37.1 C)  SpO2: 98% 95%   Pt is at the sink washing his hands.  Continues to have intermittent episodes of outbursts, aggressive behavior overnight.  Pt continues to hold in ED pending dispo.   Linwood Dibbles, MD 01/28/20 [phone]

## 2020-01-28 NOTE — ED Notes (Signed)
Patient awake after sleeping all shift to this point. Once awake used the bathroom and repeatedly washed hands. He was offered and accepted food as he slept thru dinner. He ate a sandwich,cheese and crackers with a juice. He is calm at this time.

## 2020-01-28 NOTE — ED Notes (Signed)
Patient is very agitated, hitting the glass and yelling.  Security is standing by.

## 2020-01-28 NOTE — ED Notes (Signed)
Patient began hitting mirror in his room and biting himself.  He is not redirectable at this time and security is present.

## 2020-01-28 NOTE — ED Notes (Signed)
Patient pulled staple out of his head.  Small amount of blood noted.  Site cleaned.

## 2020-01-28 NOTE — ED Notes (Signed)
Colored a few pictures, washed his hands many more time and when asked if he wanted to watch TV he said to wait. He got in bed, covered his head with his blanket and is making noises but not as loudly as earlier, not to point they would be bothering his peers. No further incidents of biting self since getting medicines.

## 2020-01-28 NOTE — ED Notes (Signed)
HS meds not given due to him being deeply asleep from prn meds received on first shift for agitation. He has been asleep since Clinical research associate arrived.
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
## 2020-01-28 NOTE — ED Notes (Signed)
Once he ate he is loud, making sounds not words. Biting on his hand and wrist. He was given his available prns as Clinical research associate didn't give him his scheduled HS meds due to his sleeping from his prns earlier in the day due to his agitation. Report from peers is he can become agitated very quickly without any discernable triggers. He is able to verbalize his wants, requested cheese and crackers and juice and was given it. Cooperative with oral and IM meds.
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
## 2020-01-29 DIAGNOSIS — F840 Autistic disorder: Secondary | ICD-10-CM | POA: Diagnosis not present

## 2020-01-29 NOTE — ED Notes (Signed)
Pt alert, pt ate breakfast.

## 2020-01-29 NOTE — BH Assessment (Signed)
BHH Assessment Progress Note  At 12:44 pt's [facility], [name], calls with updates.  She reports that pt has been approved for [place] [employer] benefits, for which she had applied some time ago.  He is to be assigned a worker next Monday, 02/02/2020.  If pt deescalates to the point that his treatment needs are subacute, they may be able to offer a crisis bed.  The [place] [facility] crisis facility is currently closed due to a positive Covid-19 case, and another patient displaced by this would have priority to return there.  Doylene Canning, [place] [facility]al [facility] Coordinator [phone]

## 2020-01-29 NOTE — ED Notes (Signed)
Pt became upset, agitated, uncooperative, difficult to redirect, pt yelling and screaming, banging on wall, biting self.  PRN Ativan 2.5 mg PO given at 1245 PRN Thorazine 25 mg IM given at 1315.

## 2020-01-29 NOTE — ED Notes (Signed)
Pt pacing. Pt yelling out, laughing inappropriately . Pt talking but can not understand what he is saying at this time.

## 2020-01-29 NOTE — BH Assessment (Signed)
BHH Assessment Progress Note  At 14:35 Nelly Rout, MD calls.  She reports that all efforts to arrange for pt be accepted to [facility] have been exhausted, and pt has been declined.  Documentation of pt's behavior at [facility] will be essential to pursue placement options going forward.  This Clinical research associate has contacted pt's [facility], [name], to notify her.  It is agreed that I will continue to contact her on a daily basis with updates.  Final disposition is pending as of this writing.  Doylene Canning, [place] [facility]al [facility] Coordinator [phone]

## 2020-01-30 DIAGNOSIS — F840 Autistic disorder: Secondary | ICD-10-CM | POA: Diagnosis not present

## 2020-01-30 MED ORDER — ZIPRASIDONE MESYLATE 20 MG IM SOLR: 20.0000 mg | Freq: Once | INTRAMUSCULAR | Status: DC

## 2020-01-30 MED ORDER — CHLORPROMAZINE HCL 25 MG/ML IJ SOLN
25.0000 mg | Freq: Once | INTRAMUSCULAR | Status: AC
  Administered 2020-01-30: 23:00:00 25 mg via INTRAMUSCULAR

## 2020-01-30 NOTE — ED Notes (Signed)
Pt wanted to watch [employer].

## 2020-01-30 NOTE — ED Notes (Signed)
Pt rested. Pt alert, up, continuously washing hands, pt redirected. Pt laid down.
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
## 2020-01-30 NOTE — Consult Note (Signed)
[facility] Psych ED Progress Note  01/30/2020 11:04 AM Jonathan Bird  MRN:  [id number]   Patient reassessed and case discussed with Dr. Dwyane Dee. Patient is a 29 year old male who was brought in by [facility] under IVC by guardian with reports of aggression and behavioral disturbances.   Today patient is alert and oriented at this time.On evaluation Jonathan Bird sitting up in bed looking at TV, when tele psych machine brought in room patient slid to edge of bed smiling. He is appropriate with staff and is able to forward some information to include what he ate for breakfast, his favorite food, address, and TV show. Patient has not exhibited any disruptive behaviors since yesterday afternoon. Last prn medications were given at 1300 (Thorazine) and 2100 Ativan. Patient has pleasant mood and does not appear to be responding to internal stimuli, or preoccupied.b Although patient is calm at this time he remains high risk for violence, behavioral outburst that are unpredictable therefore making him a risk to self and others.  He remains with limited verbal ability and unable to identify if he is having suicidal or homicidal thoughts, as well as hallucinations.   Principal Problem: Outbursts of anger Diagnosis:  Principal Problem:   Outbursts of anger Active Problems:   Autism disorder  Total Time spent with patient: 15 minutes  Past Psychiatric History: Autism spectrum disorder, fetal alcohol syndrome, intellectual disorder   Past Medical History:  Past Medical History:  Diagnosis Date  . Autism    outbursts  . Autism spectrum   . Bipolar disorder ([place])   . Diabetes mellitus without complication ([place])    boarderline  . Hypertension     Past Surgical History:  Procedure Laterality Date  . NO PAST SURGERIES    . TOOTH EXTRACTION N/A 05/25/2014   Procedure: EXTRACTION WISDOM TEETH, Extraction teeth # 1, 16, 17, 32.;  Surgeon: Gae Bon, DDS;  Location: [place];  Service: Oral Surgery;  Laterality: N/A;    Family History: History reviewed. No pertinent family history. Family Psychiatric  History: Unaware Social History:  Social History   Substance and Sexual Activity  Alcohol Use No     Social History   Substance and Sexual Activity  Drug Use No    Social History   Socioeconomic History  . Marital status: Single    Spouse name: Not on file  . Number of children: Not on file  . Years of education: Not on file  . Highest education level: Not on file  Occupational History  . Not on file  Tobacco Use  . Smoking status: Never Smoker  . Smokeless tobacco: Never Used  Substance and Sexual Activity  . Alcohol use: No  . Drug use: No  . Sexual activity: Never  Other Topics Concern  . Not on file  Social History Narrative  . Not on file   Social Determinants of Health   Financial Resource Strain:   . Difficulty of Paying Living Expenses: Not on file  Food Insecurity:   . Worried About Charity fundraiser in the Last Year: Not on file  . Ran Out of Food in the Last Year: Not on file  Transportation Needs:   . Lack of Transportation (Medical): Not on file  . Lack of Transportation (Non-Medical): Not on file  Physical Activity:   . Days of Exercise per Week: Not on file  . Minutes of Exercise per Session: Not on file  Stress:   . Feeling of  Stress : Not on file  Social Connections:   . Frequency of Communication with Friends and Family: Not on file  . Frequency of Social Gatherings with Friends and Family: Not on file  . Attends Religious Services: Not on file  . Active Member of Clubs or Organizations: Not on file  . Attends Banker Meetings: Not on file  . Marital Status: Not on file    Sleep: Fair  Appetite:  Good  Current Medications: Current Facility-Administered Medications  Medication Dose Route Frequency Provider Last Rate Last Admin  . amantadine (SYMMETREL) capsule 100 mg  100 mg Oral BID Fayrene Helper, PA-C   100 mg at 01/30/20 7253  .  carbamazepine (TEGRETOL) chewable tablet 200 mg  200 mg Oral BID Rankin, Shuvon B, NP   200 mg at 01/30/20 0909  . chlorproMAZINE (THORAZINE) injection 25 mg  25 mg Intramuscular TID PRN Rankin, Shuvon B, NP   25 mg at 01/29/20 1315  . chlorproMAZINE (THORAZINE) tablet 100 mg  100 mg Oral TID Fayrene Helper, PA-C   100 mg at 01/30/20 6644  . lidocaine-EPINEPHrine-tetracaine (LET) topical gel  3 mL Topical Once Fayrene Helper, PA-C      . loratadine (CLARITIN) tablet 10 mg  10 mg Oral Daily Fayrene Helper, PA-C   10 mg at 01/30/20 0909  . LORazepam (ATIVAN) tablet 2.5 mg  2.5 mg Oral Q4H PRN Fayrene Helper, PA-C   2.5 mg at 01/29/20 2148  . mirtazapine (REMERON) tablet 45 mg  45 mg Oral QHS Fayrene Helper, PA-C   45 mg at 01/29/20 2145   Current Outpatient Medications  Medication Sig Dispense Refill  . amantadine (SYMMETREL) 100 MG capsule Take 100 mg by mouth 2 (two) times daily.    . carbamazepine (TEGRETOL) 200 MG tablet Take 200 mg by mouth 2 (two) times daily.    . cetirizine (ZYRTEC) 10 MG tablet Take 10 mg by mouth daily.    . chlorproMAZINE (THORAZINE) 100 MG tablet Take 100 mg by mouth 3 (three) times daily.    . hydrOXYzine (ATARAX/VISTARIL) 25 MG tablet Take 25 mg by mouth 3 (three) times daily.     Marland Kitchen LORazepam (ATIVAN) 2 MG tablet Take 2.5 mg by mouth 2 (two) times daily.     . mirtazapine (REMERON) 30 MG tablet Take 45 mg by mouth at bedtime. Mat give second dose if awake in night    . fluticasone (FLONASE) 50 MCG/ACT nasal spray Place 2 sprays into the nose daily. 2 sprays to each nostril at bedtime. (Patient not taking: Reported on 01/25/2020) 16 g 2  . oxyCODONE-acetaminophen (PERCOCET) 5-325 MG per tablet Take 2 tablets by mouth every 4 (four) hours as needed for severe pain. (Patient not taking: Reported on 01/21/2020) 40 tablet 0    Lab Results: No results found for this or any previous visit (from the past 48 hour(s)).  Blood Alcohol level:  Lab Results  Component Value Date   ETH <10  01/25/2020   ETH <10 01/21/2020    Musculoskeletal: Strength & Muscle Tone: within normal limits Gait & Station: normal Patient leans: N/A  Psychiatric Specialty Exam: Physical Exam Vitals and nursing note reviewed. Exam conducted with a chaperone present Air cabin crew at bedside).  Pulmonary:     Effort: Pulmonary effort is normal.  Neurological:     Mental Status: He is alert.  Psychiatric:        Thought Content: Thought content does not include homicidal or suicidal ideation.  Cognition and Memory: Cognition is impaired. Memory is impaired.        Judgment: Judgment is impulsive.     Comments: Abnormal speech (articulation) difficult to understand. Short attention span, good mood at this time but can escalate quickly for no reason.        Review of Systems  Unable to perform ROS: Other (Intellectual ability)    Blood pressure 119/82, pulse (!) 108, temperature (!) 97.3 F (36.3 C), temperature source Oral, resp. rate 18, SpO2 99 %.There is no height or weight on file to calculate BMI.  General Appearance: Casual  Eye Contact:  Good  Speech:  non verbal, abnormal articulation  Volume:  Approriate  Mood:  appropriate  Affect:  Appropriate and Congruent  Thought Process:  Linear,  Intellectual disability, autistic disorder, child like (Unable to properly assess)   Orientation:  Other:  Person (self)  Thought Content:  Appropriate to his degree of ASD  Suicidal Thoughts:  No  Homicidal Thoughts:  No  Memory:  Immediate;   Poor Recent;   Poor  Judgement:  Other:  Impaired  Insight:  Poor  Psychomotor Activity:  Increased  Concentration:  Concentration: Poor and Attention Span: Poor  Recall:  Poor  Fund of Knowledge:  Poor  Language:  Poor  Akathisia:  No  Handed:  Right  AIMS (if indicated):     Assets:  Housing Social Support  ADL's:  Intact  Cognition:  Impaired,  Severe  Sleep:         Treatment Plan Summary: Daily contact with patient, and medication  management. Gaurdain has submitted a notice to surrender therapuetic services at this time due to the safety of self and others. Patient continues to display aggressive behaviors, and remains restless while in the ER. WIll continue to recommend Inpatient admission at this time, while working closely alongside with care coordinator team to find placement and new outpatient services.    Maryagnes Amos, FNP 01/30/2020, 11:04 AM

## 2020-01-30 NOTE — ED Notes (Signed)
Pt colored for short period of time, had a snack, now resting. Sitter at bedside.

## 2020-01-30 NOTE — ED Notes (Signed)
Pt restless. Listening to music offered.

## 2020-01-30 NOTE — ED Notes (Signed)
Attempted to charge an iPad for patient use; however, it would not turn on.

## 2020-01-30 NOTE — BH Assessment (Signed)
[facility] Assessment Progress Note  At 09:45 this Clinical research associate called [facility] in [place], [place] to inquire about bed availability.  They report that for adults they only offer residential treatment.  Crisis services are offered only to patients under 27 years of age.  At 09:50 I called  [facility] Customer Service to inquire about other treatment options that may be available for this pt.  I called [phone] and the call rolled to voice mail for Add Dinapoli.  I left a message requesting return call, leaving my name, number and work hours.  As of this writing return call is pending.  Doylene Canning, [place] [facility] Coordinator [phone]
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
## 2020-01-30 NOTE — ED Notes (Signed)
Pt alert, pt making noises. Pt coloring. Sitter at bedside

## 2020-01-30 NOTE — ED Notes (Signed)
Pt calm, cooperative, following directions. Pt showed . Pt now eating breakfast. Sitter at bedside.

## 2020-01-31 DIAGNOSIS — F840 Autistic disorder: Secondary | ICD-10-CM | POA: Diagnosis not present
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
## 2020-01-31 NOTE — ED Notes (Signed)
Pt is singing in room.

## 2020-01-31 NOTE — ED Provider Notes (Signed)
Emergency Medicine Observation Re-evaluation Note  Jonathan Bird is a 29 y.o. male, seen on rounds today.  Pt initially presented to the ED for complaints of Behavioral episode and Autistic Currently, the patient is in four-point restraints, with mild hypertension (151/100), and patient request to "go to the bathroom.".  Physical Exam  BP (!) 151/100 (BP Location: Left Arm)   Pulse (!) 102   Temp 98.7 F (37.1 C) (Oral)   Resp 18   SpO2 100%  Physical Exam  ED Course / MDM  EKG:EKG Interpretation  Date/Time:  Monday January 26 2020 12:05:50 EST Ventricular Rate:  110 PR Interval:  166 QRS Duration: 96 QT Interval:  348 QTC Calculation: 470 R Axis:   92 Text Interpretation: Sinus tachycardia Rightward axis Borderline ECG No acute changes No significant change since last tracing Confirmed by Derwood Kaplan [phone]) on 01/26/2020 11:29:47 PM  The patient has been placed in psychiatric observation due to the need to provide a safe environment for the patient while obtaining psychiatric consultation and evaluation, as well as ongoing medical and medication management to treat the patient's condition.  The patient has been placed under full IVC at this time.    I have reviewed the labs performed to date as well as medications administered while in observation.  Recent changes in the last 24 hours include more agitation requiring four-point restraints, mild hypertension, not suspected to be hypertensive urgency, and patient need to go to the bathroom. Plan  Current plan is for continue to monitor blood pressure.  Assess for ability to void and screen for urinary retention if unable to urinate. Patient is under full IVC at this time.   Mancel Bale, MD 01/31/20 1019

## 2020-02-01 DIAGNOSIS — F840 Autistic disorder: Secondary | ICD-10-CM | POA: Diagnosis not present

## 2020-02-01 MED ORDER — ZIPRASIDONE MESYLATE 20 MG IM SOLR
20.0000 mg | Freq: Once | INTRAMUSCULAR | Status: AC
  Administered 2020-02-01: 20 mg via INTRAMUSCULAR
  Filled 2020-02-01: qty 20

## 2020-02-01 MED ORDER — LORAZEPAM 1 MG PO TABS
2.0000 mg | ORAL_TABLET | Freq: Once | ORAL | Status: AC
  Administered 2020-02-01: 2 mg via ORAL
  Filled 2020-02-01: qty 2

## 2020-02-01 MED ORDER — STERILE WATER FOR INJECTION IJ SOLN
INTRAMUSCULAR | Status: AC
  Administered 2020-02-01: 19:00:00 10 mL
  Filled 2020-02-01: qty 10

## 2020-02-01 NOTE — Progress Notes (Signed)
02/01/2020  1720  Patient agitated hitting his head on the wall, falling to the floor, biting his hand, yelling. MD came to see patient.

## 2020-02-01 NOTE — ED Provider Notes (Signed)
Emergency Medicine Observation Re-evaluation Note  Jonathan Bird is a 29 y.o. male, seen on rounds today.  Pt initially presented to the ED for complaints of Behavioral episode and Autistic Currently, the patient is calm and redirectable.  Physical Exam  BP 139/89 (BP Location: Left Arm)   Pulse (!) 107   Temp 98.1 F (36.7 C) (Oral)   Resp 18   SpO2 100%  Physical Exam Ambulating in room at this time.  Continues to be confused ED Course / MDM  EKG:EKG Interpretation  Date/Time:  Monday January 26 2020 12:05:50 EST Ventricular Rate:  110 PR Interval:  166 QRS Duration: 96 QT Interval:  348 QTC Calculation: 470 R Axis:   92 Text Interpretation: Sinus tachycardia Rightward axis Borderline ECG No acute changes No significant change since last tracing Confirmed by Derwood Kaplan [phone]) on 01/26/2020 11:29:47 PM    I have reviewed the labs performed to date as well as medications administered while in observation.  Recent changes in the last 24 hours include waxing and waning aggression and violence. Plan  Current plan is for psychiatric placement. Patient is under full IVC at this time.   Mancel Bale, MD 02/01/20 [phone]

## 2020-02-01 NOTE — Progress Notes (Signed)
02/01/2020  1300  Patient agitated, hitting his hand on the table/door, biting his hand, falling on the bed and falling on the floor. PRN medication given.

## 2020-02-01 NOTE — Consult Note (Signed)
[facility] Psych ED Progress Note  02/01/2020 2:29 PM Jonathan Bird  MRN:  [id number]   Patient reassessed and case discussed with nursing staff. Patient is a 29 year old male who was brought in by [place] under IVC by guardian with reports of aggression and behavioral disturbances.   Today patient is alert and oriented at this time. On evaluation patient is sitting on his bed, and was looking at the machine smiling.During this time he is appropriate with staff. Although during his chart review he continues to exhibit disruptive behaviors and requires multiple prn medications. Please see nurses notes. Patient has a pleasant mood at this time and does not appear to be responding to internal stimuli. He remains with limited verbal ability and communicates when speaking to patient directly. Will continue to monitor.    Principal Problem: Outbursts of anger Diagnosis:  Principal Problem:   Outbursts of anger Active Problems:   Autism disorder  Total Time spent with patient: 15 minutes  Past Psychiatric History: Autism spectrum disorder, fetal alcohol syndrome, intellectual disorder   Past Medical History:  Past Medical History:  Diagnosis Date  . Autism    outbursts  . Autism spectrum   . Bipolar disorder ([place])   . Diabetes mellitus without complication ([place])    boarderline  . Hypertension     Past Surgical History:  Procedure Laterality Date  . NO PAST SURGERIES    . TOOTH EXTRACTION N/A 05/25/2014   Procedure: EXTRACTION WISDOM TEETH, Extraction teeth # 1, 16, 17, 32.;  Surgeon: Gae Bon, DDS;  Location: [facility];  Service: Oral Surgery;  Laterality: N/A;   Family History: History reviewed. No pertinent family history. Family Psychiatric  History: Unaware Social History:  Social History   Substance and Sexual Activity  Alcohol Use No     Social History   Substance and Sexual Activity  Drug Use No    Social History   Socioeconomic History  . Marital status: Single    Spouse  name: Not on file  . Number of children: Not on file  . Years of education: Not on file  . Highest education level: Not on file  Occupational History  . Not on file  Tobacco Use  . Smoking status: Never Smoker  . Smokeless tobacco: Never Used  Substance and Sexual Activity  . Alcohol use: No  . Drug use: No  . Sexual activity: Never  Other Topics Concern  . Not on file  Social History Narrative  . Not on file   Social Determinants of Health   Financial Resource Strain:   . Difficulty of Paying Living Expenses: Not on file  Food Insecurity:   . Worried About Charity fundraiser in the Last Year: Not on file  . Ran Out of Food in the Last Year: Not on file  Transportation Needs:   . Lack of Transportation (Medical): Not on file  . Lack of Transportation (Non-Medical): Not on file  Physical Activity:   . Days of Exercise per Week: Not on file  . Minutes of Exercise per Session: Not on file  Stress:   . Feeling of Stress : Not on file  Social Connections:   . Frequency of Communication with Friends and Family: Not on file  . Frequency of Social Gatherings with Friends and Family: Not on file  . Attends Religious Services: Not on file  . Active Member of Clubs or Organizations: Not on file  . Attends Archivist Meetings:  Not on file  . Marital Status: Not on file    Sleep: Fair  Appetite:  Good  Current Medications: Current Facility-Administered Medications  Medication Dose Route Frequency Provider Last Rate Last Admin  . amantadine (SYMMETREL) capsule 100 mg  100 mg Oral BID Fayrene Helper, PA-C   100 mg at 02/01/20 1013  . carbamazepine (TEGRETOL) chewable tablet 200 mg  200 mg Oral BID Rankin, Shuvon B, NP   200 mg at 02/01/20 1013  . chlorproMAZINE (THORAZINE) injection 25 mg  25 mg Intramuscular TID PRN Rankin, Shuvon B, NP   25 mg at 02/01/20 1304  . chlorproMAZINE (THORAZINE) tablet 100 mg  100 mg Oral TID Fayrene Helper, PA-C   100 mg at 02/01/20 1013  .  lidocaine-EPINEPHrine-tetracaine (LET) topical gel  3 mL Topical Once Fayrene Helper, PA-C      . loratadine (CLARITIN) tablet 10 mg  10 mg Oral Daily Fayrene Helper, PA-C   10 mg at 02/01/20 1013  . LORazepam (ATIVAN) tablet 2.5 mg  2.5 mg Oral Q4H PRN Fayrene Helper, PA-C   2.5 mg at 02/01/20 1304  . mirtazapine (REMERON) tablet 45 mg  45 mg Oral QHS Fayrene Helper, PA-C   45 mg at 01/31/20 2037   Current Outpatient Medications  Medication Sig Dispense Refill  . amantadine (SYMMETREL) 100 MG capsule Take 100 mg by mouth 2 (two) times daily.    . carbamazepine (TEGRETOL) 200 MG tablet Take 200 mg by mouth 2 (two) times daily.    . cetirizine (ZYRTEC) 10 MG tablet Take 10 mg by mouth daily.    . chlorproMAZINE (THORAZINE) 100 MG tablet Take 100 mg by mouth 3 (three) times daily.    . hydrOXYzine (ATARAX/VISTARIL) 25 MG tablet Take 25 mg by mouth 3 (three) times daily.     Marland Kitchen LORazepam (ATIVAN) 2 MG tablet Take 2.5 mg by mouth 2 (two) times daily.     . mirtazapine (REMERON) 30 MG tablet Take 45 mg by mouth at bedtime. Mat give second dose if awake in night    . fluticasone (FLONASE) 50 MCG/ACT nasal spray Place 2 sprays into the nose daily. 2 sprays to each nostril at bedtime. (Patient not taking: Reported on 01/25/2020) 16 g 2  . oxyCODONE-acetaminophen (PERCOCET) 5-325 MG per tablet Take 2 tablets by mouth every 4 (four) hours as needed for severe pain. (Patient not taking: Reported on 01/21/2020) 40 tablet 0    Lab Results: No results found for this or any previous visit (from the past 48 hour(s)).  Blood Alcohol level:  Lab Results  Component Value Date   ETH <10 01/25/2020   ETH <10 01/21/2020    Musculoskeletal: Strength & Muscle Tone: within normal limits Gait & Station: normal Patient leans: N/A  Psychiatric Specialty Exam: Physical Exam Vitals and nursing note reviewed. Exam conducted with a chaperone present Air cabin crew at bedside).  Pulmonary:     Effort: Pulmonary effort is normal.   Neurological:     Mental Status: He is alert.  Psychiatric:        Thought Content: Thought content does not include homicidal or suicidal ideation.        Cognition and Memory: Cognition is impaired. Memory is impaired.        Judgment: Judgment is impulsive.     Comments: Abnormal speech (articulation) difficult to understand. Short attention span, good mood at this time but can escalate quickly for no reason.        Review of Systems  Unable to perform ROS: Other (Intellectual ability)    Blood pressure 140/90, pulse (!) 102, temperature 98.6 F (37 C), temperature source Oral, resp. rate 18, SpO2 100 %.There is no height or weight on file to calculate BMI.  General Appearance: Casual  Eye Contact:  Good  Speech:  non verbal, abnormal articulation  Volume:  Approriate  Mood:  Anxious  Affect:  Appropriate and Congruent  Thought Process:  Linear and Descriptions of Associations: Intact,  Intellectual disability, autistic disorder, child like (Unable to properly assess)   Orientation:  Other:  Person (self)  Thought Content:  Appropriate to his degree of ASD  Suicidal Thoughts:  No  Homicidal Thoughts:  No  Memory:  Immediate;   Poor Recent;   Poor  Judgement:  Other:  Impaired  Insight:  Poor  Psychomotor Activity:  Increased  Concentration:  Concentration: Poor and Attention Span: Poor  Recall:  Poor  Fund of Knowledge:  Poor  Language:  Poor  Akathisia:  No  Handed:  Right  AIMS (if indicated):     Assets:  Housing Social Support  ADL's:  Intact  Cognition:  Impaired,  Severe  Sleep:         Treatment Plan Summary: Daily contact with patient, and medication management. Gaurdain has submitted a notice to surrender therapuetic services at this time due to the safety of self and others. Patient continues to display aggressive behaviors, and remains restless while in the ER. WIll continue to recommend Inpatient admission at this time, while working closely alongside  with care coordinator team to find placement and new outpatient services.  continue to administer prn medications at this time for disruptive behaviors and aggression.   Maryagnes Amos, FNP 02/01/2020, 2:29 PM

## 2020-02-01 NOTE — Progress Notes (Signed)
DC Violent restraint this evening. Patient seems calm. Will restart if patient get agitated again. Will continue to monitor.

## 2020-02-01 NOTE — Progress Notes (Signed)
02/01/2020  1805  Notified MD of patient's BP 150/100. Waiting for orders. Will continue to monitor.

## 2020-02-02 DIAGNOSIS — F840 Autistic disorder: Secondary | ICD-10-CM | POA: Diagnosis not present

## 2020-02-02 MED ORDER — LORAZEPAM 2 MG/ML IJ SOLN
2.0000 mg | Freq: Once | INTRAMUSCULAR | Status: AC
  Administered 2020-02-02: 04:00:00 2 mg via INTRAMUSCULAR
  Filled 2020-02-02: qty 1

## 2020-02-02 MED ORDER — ZIPRASIDONE MESYLATE 20 MG IM SOLR
20.0000 mg | Freq: Once | INTRAMUSCULAR | Status: AC
  Administered 2020-02-04: 20 mg via INTRAMUSCULAR
  Filled 2020-02-02 (×2): qty 20

## 2020-02-02 NOTE — ED Notes (Signed)
Patient given haircut and shaved

## 2020-02-02 NOTE — Consult Note (Signed)
[facility] Psych ED Progress Note  02/02/2020 11:47 AM Jonathan Bird  MRN:  [id number]   Patient reassessed and case discussed with nursing staff. Patient is a 29 year old male who was brought in by [facility] under IVC by guardian with reports of aggression and behavioral disturbances.     [name], 29 y.o., male patient seen via tele psych for psychiatric follow up by this provider, consulted with Dr. Lucianne Muss; and chart reviewed on 02/02/20.  On evaluation Jonathan Bird is sitting at the foot of the bed looking at telepsych machine smiling.  Currently patient is alert/oriented to self and place.  Reports he has been good and that he ate a good breakfast.  At this time patient is calm and cooperative but has continued to have episodes of discharge eruptive behavior that requires medication for agitation/behaviors.  Patient does not appear to be responding to internal/external stimuli.  Patient does continue to have limited verbal ability and communicates when speaking to him directly.  Will continue to monitor and seek psychiatric placement.      Principal Problem: Outbursts of anger Diagnosis:  Principal Problem:   Outbursts of anger Active Problems:   Autism disorder  Total Time spent with patient: 15 minutes  Past Psychiatric History: Autism spectrum disorder, fetal alcohol syndrome, intellectual disorder   Past Medical History:  Past Medical History:  Diagnosis Date  . Autism    outbursts  . Autism spectrum   . Bipolar disorder ([facility])   . Diabetes mellitus without complication ([facility])    boarderline  . Hypertension     Past Surgical History:  Procedure Laterality Date  . NO PAST SURGERIES    . TOOTH EXTRACTION N/A 05/25/2014   Procedure: EXTRACTION WISDOM TEETH, Extraction teeth # 1, 16, 17, 32.;  Surgeon: Georgia Lopes, DDS;  Location: [facility];  Service: Oral Surgery;  Laterality: N/A;   Family History: History reviewed. No pertinent family history. Family Psychiatric  History:  Unaware Social History:  Social History   Substance and Sexual Activity  Alcohol Use No     Social History   Substance and Sexual Activity  Drug Use No    Social History   Socioeconomic History  . Marital status: Single    Spouse name: Not on file  . Number of children: Not on file  . Years of education: Not on file  . Highest education level: Not on file  Occupational History  . Not on file  Tobacco Use  . Smoking status: Never Smoker  . Smokeless tobacco: Never Used  Substance and Sexual Activity  . Alcohol use: No  . Drug use: No  . Sexual activity: Never  Other Topics Concern  . Not on file  Social History Narrative  . Not on file   Social Determinants of Health   Financial Resource Strain:   . Difficulty of Paying Living Expenses: Not on file  Food Insecurity:   . Worried About Programme researcher, broadcasting/film/video in the Last Year: Not on file  . Ran Out of Food in the Last Year: Not on file  Transportation Needs:   . Lack of Transportation (Medical): Not on file  . Lack of Transportation (Non-Medical): Not on file  Physical Activity:   . Days of Exercise per Week: Not on file  . Minutes of Exercise per Session: Not on file  Stress:   . Feeling of Stress : Not on file  Social Connections:   . Frequency of Communication  with Friends and Family: Not on file  . Frequency of Social Gatherings with Friends and Family: Not on file  . Attends Religious Services: Not on file  . Active Member of Clubs or Organizations: Not on file  . Attends Archivist Meetings: Not on file  . Marital Status: Not on file    Sleep: Fair  Appetite:  Good  Current Medications: Current Facility-Administered Medications  Medication Dose Route Frequency Provider Last Rate Last Admin  . amantadine (SYMMETREL) capsule 100 mg  100 mg Oral BID Domenic Moras, PA-C   100 mg at 02/02/20 0920  . carbamazepine (TEGRETOL) chewable tablet 200 mg  200 mg Oral BID Vondra Aldredge B, NP   200 mg at  02/02/20 0920  . chlorproMAZINE (THORAZINE) injection 25 mg  25 mg Intramuscular TID PRN Martavis Gurney B, NP   25 mg at 02/02/20 0217  . chlorproMAZINE (THORAZINE) tablet 100 mg  100 mg Oral TID Domenic Moras, PA-C   100 mg at 02/02/20 0920  . lidocaine-EPINEPHrine-tetracaine (LET) topical gel  3 mL Topical Once Domenic Moras, PA-C      . loratadine (CLARITIN) tablet 10 mg  10 mg Oral Daily Domenic Moras, PA-C   10 mg at 02/02/20 0920  . LORazepam (ATIVAN) tablet 2.5 mg  2.5 mg Oral Q4H PRN Domenic Moras, PA-C   2.5 mg at 02/02/20 0344  . mirtazapine (REMERON) tablet 45 mg  45 mg Oral QHS Domenic Moras, PA-C   45 mg at 02/01/20 2015   Current Outpatient Medications  Medication Sig Dispense Refill  . amantadine (SYMMETREL) 100 MG capsule Take 100 mg by mouth 2 (two) times daily.    . carbamazepine (TEGRETOL) 200 MG tablet Take 200 mg by mouth 2 (two) times daily.    . cetirizine (ZYRTEC) 10 MG tablet Take 10 mg by mouth daily.    . chlorproMAZINE (THORAZINE) 100 MG tablet Take 100 mg by mouth 3 (three) times daily.    . hydrOXYzine (ATARAX/VISTARIL) 25 MG tablet Take 25 mg by mouth 3 (three) times daily.     Marland Kitchen LORazepam (ATIVAN) 2 MG tablet Take 2.5 mg by mouth 2 (two) times daily.     . mirtazapine (REMERON) 30 MG tablet Take 45 mg by mouth at bedtime. Mat give second dose if awake in night    . fluticasone (FLONASE) 50 MCG/ACT nasal spray Place 2 sprays into the nose daily. 2 sprays to each nostril at bedtime. (Patient not taking: Reported on 01/25/2020) 16 g 2  . oxyCODONE-acetaminophen (PERCOCET) 5-325 MG per tablet Take 2 tablets by mouth every 4 (four) hours as needed for severe pain. (Patient not taking: Reported on 01/21/2020) 40 tablet 0    Lab Results: No results found for this or any previous visit (from the past 48 hour(s)).  Blood Alcohol level:  Lab Results  Component Value Date   ETH <10 01/25/2020   ETH <10 01/21/2020    Musculoskeletal: Strength & Muscle Tone: within normal  limits Gait & Station: normal Patient leans: N/A  Psychiatric Specialty Exam: Physical Exam Vitals and nursing note reviewed. Exam conducted with a chaperone present Lexicographer at bedside).  Pulmonary:     Effort: Pulmonary effort is normal.  Neurological:     Mental Status: He is alert.  Psychiatric:        Thought Content: Thought content does not include homicidal or suicidal ideation.        Cognition and Memory: Cognition is impaired. Memory is impaired.  Judgment: Judgment is impulsive.     Comments: Abnormal speech (articulation) difficult to understand. Short attention span, good mood at this time but can escalate quickly for no reason.        Review of Systems  Unable to perform ROS: Other (Intellectual ability)    Blood pressure (!) 150/87, pulse (!) 109, temperature 98.4 F (36.9 C), temperature source Oral, resp. rate (!) 22, SpO2 100 %.There is no height or weight on file to calculate BMI.  General Appearance: Casual  Eye Contact:  Good  Speech:  verbal, abnormal articulation  Volume:  Approriate  Mood:  Anxious  Affect:  Appropriate and Congruent  Thought Process:  Linear and Descriptions of Associations: Intact,  Intellectual disability, autistic disorder, child like (Unable to properly assess)   Orientation:  Other:  Person (self)  Thought Content:  Appropriate to his degree of ASD  Suicidal Thoughts:  No  Homicidal Thoughts:  No  Memory:  Immediate;   Poor Recent;   Poor  Judgement:  Other:  Impaired  Insight:  Poor  Psychomotor Activity:  Increased  Concentration:  Concentration: Poor and Attention Span: Poor  Recall:  Poor  Fund of Knowledge:  Poor  Language:  Poor  Akathisia:  No  Handed:  Right  AIMS (if indicated):     Assets:  Housing Social Support  ADL's:  Intact  Cognition:  Impaired,  Severe  Sleep:         Treatment Plan Summary: Daily contact with patient, and medication management. Gaurdain has submitted a notice to surrender  therapuetic services at this time due to the safety of self and others. Patient continues to display aggressive behaviors, and remains restless while in the ER. WIll continue to recommend Inpatient admission at this time, while working closely alongside with care coordinator team to find placement and new outpatient services.  continue to administer prn medications at this time for disruptive behaviors and aggression.   Will continue with current treatment plan; no changes at this time  Analyssa Downs, NP 02/02/2020, 11:47 AM
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
## 2020-02-02 NOTE — ED Notes (Addendum)
Patient is resting quietly with eyes closed. Will hold Thorazine until patient becomes awake since he asleep. Observed rise and fall of chest.

## 2020-02-02 NOTE — ED Provider Notes (Signed)
4:05 AM  Pt currently awaiting placement.  Nursing staff reports that patient appears agitated and may need more sedation to prevent outburst.  Has received frequent IM medications for aggressive behavior, outburst.  Received Geodon 20 mg last on 02/01/2020 at 1849.  Will give 2 mg of IM Ativan now and nurse will reassess to see if he needs further sedation.     Jrue Yambao, Layla Maw, DO 02/02/20 [phone]

## 2020-02-02 NOTE — ED Notes (Signed)
Patient has been up since MN constantly in and out of bathroom, constantly washing hands and wiping the table down.  Given multiple snacks and PRN meds, will keep monitor his behavior.

## 2020-02-02 NOTE — ED Notes (Signed)
Patient happy in room, singing and dancing.

## 2020-02-02 NOTE — ED Notes (Signed)
Patient calm, cooperative, listening to music and dancing in room

## 2020-02-02 NOTE — ED Notes (Signed)
Patient sit on the floor and started to yell. Gave water and redirected back to bed

## 2020-02-03 DIAGNOSIS — F840 Autistic disorder: Secondary | ICD-10-CM | POA: Diagnosis not present

## 2020-02-03 MED ORDER — ZIPRASIDONE MESYLATE 20 MG IM SOLR
20.0000 mg | Freq: Once | INTRAMUSCULAR | Status: AC
  Administered 2020-02-03: 09:00:00 20 mg via INTRAMUSCULAR
  Filled 2020-02-03: qty 20

## 2020-02-03 MED ORDER — ZIPRASIDONE MESYLATE 20 MG IM SOLR
20.0000 mg | Freq: Once | INTRAMUSCULAR | Status: AC
  Administered 2020-02-03: 17:00:00 20 mg via INTRAMUSCULAR
  Filled 2020-02-03: qty 20

## 2020-02-03 MED ORDER — STERILE WATER FOR INJECTION IJ SOLN
INTRAMUSCULAR | Status: AC
  Administered 2020-02-03: 17:00:00 10 mL
  Filled 2020-02-03: qty 10

## 2020-02-03 NOTE — BH Assessment (Signed)
[facility] Assessment Progress Note  Per Nelly Rout, MD, this pt continues to require psychiatric hospitalization at this time.  Pt's IVC expires today, but Dr Lucianne Muss finds that pt continues to meet criteria for IVC, which she has re-initiated.  IVC documents have been faxed to [facility], and at 12:57 Hortense Ramal confirms receipt.  He has since faxed Findings and Custody Order to this Clinical research associate.  At 13:10 I called [employer] and spoke to [employer], who took demographic information, agreeing to dispatch law enforcement to fill out Return of Service.  As of this writing, arrival of law enforcement is pending.  At 13:00 I received a call from [name], pt's [facility].  I updated her on pt's behavior and disposition.  She reports that pt has been assigned an [facility] care coordinator, although she does not have the name or contact information for the individual.  She will be asking the worker to come to [facility] to evaluate pt for assistance with stabilizing and dispositioning him.  In the meantime, [name] continues to seek placement for the pt in the community, including referring him to [facility].  She reports that Adonis Huguenin has filed paperwork with the court to be released from guardianship.  Court date is pending at this time.  At 13:25 I called [facility].  They report that their IDD unit is currently at capacity.  Doylene Canning, [place] Behavioral Health Coordinator [phone]

## 2020-02-03 NOTE — ED Notes (Addendum)
Patient just took a shower and is back in room. Patient is calm and listening to music.

## 2020-02-03 NOTE — Consult Note (Signed)
  Attempted to see Jonathan Bird, 29 y.o., male via tele psych for psychiatric follow up but patient sleeping.  Staff reports that patient became upset this morning when he wanted to eat breakfast and take a shower but breakfast was not there.  States that patient had to be medicated to get him to calm down and is now sleeping.  Patient is more stable with medication changes and has been tolerating his medication.  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  Patient can be pleasant, and cooperative with staff, and he likes music.  It has been suggested to give patient something such as an iPad that will help with visual stimulation and keep patient calm and occupied.   Social work continues work with [employer] for patient placement.  Will continue with current treatment plan and working with [facility] coordinator for patient placement

## 2020-02-03 NOTE — ED Notes (Addendum)
Patient began throwing himself at the wall, on the floor, and banging on the bed. Dr. Lynelle Doctor placed Geodon order and RN gave it. Patient was given ice cream and calmed down a little. Patient is still yelling but is no longer acting out violently. Will continue to monitor patient.

## 2020-02-03 NOTE — ED Provider Notes (Signed)
Notified pt is agitated again.  He is screaming and yelling.  Pacing, lunging aggressively.  Intermittently will calm down.  Staff attempting to redirect.  Will give dose of geodon IM.  Last dose early this am.   Linwood Dibbles, MD 02/03/20 [phone]

## 2020-02-03 NOTE — Progress Notes (Signed)
Patient still agressively agitated. Paged Dr. Elesa Massed. New order for one-time geodan order in epic and given.

## 2020-02-03 NOTE — Progress Notes (Signed)
Patient got very agitated after getting his VSs taken. Called security. He was cooperative while giving IM thorazine. Will continue to monitor.

## 2020-02-03 NOTE — ED Notes (Addendum)
Patient began screaming and hitting himself against walls and bed. RN asked patient to stop 3 times and patient continued to throw himself against wall and on the floor. RN gave patient 20 geodon. Patient is now cooperative and eating breakfast. Patient will have shower after breakfast is finished. Will continue to monitor.

## 2020-02-03 NOTE — ED Provider Notes (Signed)
7:10 AM  Pt continues to be agitated per nurse despite 5 mg of oral Ativan that has been ordered as needed.  Will give 20 mg of IM Geodon.  It has been over 24 hours since he received Geodon.   Chynah Orihuela, Layla Maw, DO 02/03/20 [phone]

## 2020-02-03 NOTE — Progress Notes (Signed)
Patient became very agitated again. Dr. Elesa Massed said okay to give PRN PO ativan early. Will continue to monitor.

## 2020-02-04 DIAGNOSIS — R454 Irritability and anger: Secondary | ICD-10-CM | POA: Insufficient documentation

## 2020-02-04 DIAGNOSIS — F840 Autistic disorder: Secondary | ICD-10-CM | POA: Diagnosis not present

## 2020-02-04 DIAGNOSIS — Q860 Fetal alcohol syndrome (dysmorphic): Secondary | ICD-10-CM

## 2020-02-04 DIAGNOSIS — F79 Unspecified intellectual disabilities: Secondary | ICD-10-CM

## 2020-02-04 MED ORDER — ZIPRASIDONE MESYLATE 20 MG IM SOLR
10.0000 mg | Freq: Once | INTRAMUSCULAR | Status: AC
  Administered 2020-02-04: 03:00:00 10 mg via INTRAMUSCULAR
  Filled 2020-02-04: qty 20

## 2020-02-04 NOTE — ED Provider Notes (Signed)
The patient has been placed in psychiatric observation due to the need to provide a safe environment for the patient while obtaining psychiatric consultation and evaluation, as well as ongoing medical and medication management to treat the patient's condition.  The patient has been placed under full IVC at this time.    Cathren Laine, MD 02/04/20 [phone]

## 2020-02-04 NOTE — Progress Notes (Signed)
02/04/2020  0805  Patient listening to music and calm.

## 2020-02-04 NOTE — Consult Note (Signed)
[facility] Psych ED Progress Note  02/04/2020 11:00 AM Jonathan Bird  MRN:  [id number]   Patient reassessed and case discussed with nursing staff. Patient is a 29 year old male who was brought in by [facility] under IVC by guardian with reports of aggression and behavioral disturbances.   Follow Up Assessment: Jonathan Bird, 29 y.o., male patient seen via tele psych for psychiatric follow up by this provider, Dr. Dwyane Dee; and chart reviewed on 02/04/20.  On evaluation Jonathan Bird is sitting at the foot of the bed.  Patient is alert/oriented to self and place.  He is currently he is calm/cooperative and in a pleasant mood smiling. Patient does not appear to be responding to internal/external stimuli.  Patient does continue to have limited verbal ability and communicates when speaking to him directly.  Patient asked about his breakfast and replies that he had "french toast." this morning and when asked if he has been good he replies "yeah."  Patient asked what he was having for lunch; he replied "cheese pizza with fries, and cheese cake."   Patient asked what his favorite thing to do and he replied "pink car, red car, white car."  Patient wave bye when tele psych machine was being taken from room.  Patient nurse also in room and states that patient has had good behavior this morning, reporting that he has had his shower and his breakfast.  Has been a little hyper but no behavioral outburst so far.  Patient continues to have episodes of agitation or eruptive behaviors that requires medication to calm his agitation/behavior.  Patient is usually calm/cooperative and commutative with staff but can change quickly.  Patient has an incident of agitation yesterday morning when he didn't get his shower or breakfast when wanted yesterday and started throwing himself against the wall and banging his head on bed; he had to be medicated to calm down.  Assuming patients outburst was related to his schedule of wanting breakfast and  shower first thing in morning when he wakes; when it did not happen patient had a behavioral outburst requiring him to be medicated. Jonathan Bird stability has improved since his admission to the ED with medication adjustments.  ED is not a good environment for this patient and outburst are assumed to occur from time to time related to patients history of autism, IQ 73, alcohol syndrome, and limited verbal ability.  Suggestions have been made for things that will keep patient occupied such as an iPad since this patient likes visual stimulation and music.   Social work continues to work with [employer] from placement.  Psychiatry will continue to monitor until placement is found.  Patient appears to be at his baseline much better with placement out of the ED where he is not limited.    Principal Problem: Aggression Diagnosis:  Principal Problem:   Aggression Active Problems:   Autism disorder   Fetal alcohol syndrome   Intellectual disability  Total Time spent with patient: 15 minutes  Past Psychiatric History: Autism spectrum disorder, fetal alcohol syndrome, intellectual disorder   Past Medical History:  Past Medical History:  Diagnosis Date  . Autism    outbursts  . Autism spectrum   . Bipolar disorder ([place])   . Diabetes mellitus without complication ([place])    boarderline  . Hypertension     Past Surgical History:  Procedure Laterality Date  . NO PAST SURGERIES    . TOOTH EXTRACTION N/A 05/25/2014   Procedure: EXTRACTION WISDOM  TEETH, Extraction teeth # 1, 16, 17, 32.;  Surgeon: Georgia Lopes, DDS;  Location: [facility] OR;  Service: Oral Surgery;  Laterality: N/A;   Family History: History reviewed. No pertinent family history. Family Psychiatric  History: Unaware Social History:  Social History   Substance and Sexual Activity  Alcohol Use No     Social History   Substance and Sexual Activity  Drug Use No    Social History   Socioeconomic History  . Marital status: Single     Spouse name: Not on file  . Number of children: Not on file  . Years of education: Not on file  . Highest education level: Not on file  Occupational History  . Not on file  Tobacco Use  . Smoking status: Never Smoker  . Smokeless tobacco: Never Used  Substance and Sexual Activity  . Alcohol use: No  . Drug use: No  . Sexual activity: Never  Other Topics Concern  . Not on file  Social History Narrative  . Not on file   Social Determinants of Health   Financial Resource Strain:   . Difficulty of Paying Living Expenses: Not on file  Food Insecurity:   . Worried About Programme researcher, broadcasting/film/video in the Last Year: Not on file  . Ran Out of Food in the Last Year: Not on file  Transportation Needs:   . Lack of Transportation (Medical): Not on file  . Lack of Transportation (Non-Medical): Not on file  Physical Activity:   . Days of Exercise per Week: Not on file  . Minutes of Exercise per Session: Not on file  Stress:   . Feeling of Stress : Not on file  Social Connections:   . Frequency of Communication with Friends and Family: Not on file  . Frequency of Social Gatherings with Friends and Family: Not on file  . Attends Religious Services: Not on file  . Active Member of Clubs or Organizations: Not on file  . Attends Banker Meetings: Not on file  . Marital Status: Not on file    Sleep: Good  Appetite:  Good  Current Medications: Current Facility-Administered Medications  Medication Dose Route Frequency Provider Last Rate Last Admin  . amantadine (SYMMETREL) capsule 100 mg  100 mg Oral BID Fayrene Helper, PA-C   100 mg at 02/04/20 8182  . carbamazepine (TEGRETOL) chewable tablet 200 mg  200 mg Oral BID Nicolas Banh B, NP   200 mg at 02/04/20 0929  . chlorproMAZINE (THORAZINE) injection 25 mg  25 mg Intramuscular TID PRN Rayn Enderson B, NP   25 mg at 02/04/20 0000  . chlorproMAZINE (THORAZINE) tablet 100 mg  100 mg Oral TID Fayrene Helper, PA-C   100 mg at 02/04/20  9937  . lidocaine-EPINEPHrine-tetracaine (LET) topical gel  3 mL Topical Once Fayrene Helper, PA-C      . loratadine (CLARITIN) tablet 10 mg  10 mg Oral Daily Fayrene Helper, PA-C   10 mg at 02/04/20 1696  . LORazepam (ATIVAN) tablet 2.5 mg  2.5 mg Oral Q4H PRN Fayrene Helper, PA-C   2.5 mg at 02/04/20 0200  . mirtazapine (REMERON) tablet 45 mg  45 mg Oral QHS Fayrene Helper, PA-C   45 mg at 02/03/20 2130  . ziprasidone (GEODON) injection 20 mg  20 mg Intramuscular Once Tegeler, Canary Brim, MD       Current Outpatient Medications  Medication Sig Dispense Refill  . amantadine (SYMMETREL) 100 MG capsule Take 100 mg by  mouth 2 (two) times daily.    . carbamazepine (TEGRETOL) 200 MG tablet Take 200 mg by mouth 2 (two) times daily.    . cetirizine (ZYRTEC) 10 MG tablet Take 10 mg by mouth daily.    . chlorproMAZINE (THORAZINE) 100 MG tablet Take 100 mg by mouth 3 (three) times daily.    . hydrOXYzine (ATARAX/VISTARIL) 25 MG tablet Take 25 mg by mouth 3 (three) times daily.     Marland Kitchen LORazepam (ATIVAN) 2 MG tablet Take 2.5 mg by mouth 2 (two) times daily.     . mirtazapine (REMERON) 30 MG tablet Take 45 mg by mouth at bedtime. Mat give second dose if awake in night    . fluticasone (FLONASE) 50 MCG/ACT nasal spray Place 2 sprays into the nose daily. 2 sprays to each nostril at bedtime. (Patient not taking: Reported on 01/25/2020) 16 g 2  . oxyCODONE-acetaminophen (PERCOCET) 5-325 MG per tablet Take 2 tablets by mouth every 4 (four) hours as needed for severe pain. (Patient not taking: Reported on 01/21/2020) 40 tablet 0    Lab Results: No results found for this or any previous visit (from the past 48 hour(s)).  Blood Alcohol level:  Lab Results  Component Value Date   ETH <10 01/25/2020   ETH <10 01/21/2020    Musculoskeletal: Strength & Muscle Tone: within normal limits Gait & Station: normal Patient leans: N/A  Psychiatric Specialty Exam: Physical Exam Vitals and nursing note reviewed. Exam  conducted with a chaperone present Air cabin crew at bedside).  Pulmonary:     Effort: Pulmonary effort is normal.  Neurological:     Mental Status: He is alert.  Psychiatric:        Thought Content: Thought content does not include homicidal or suicidal ideation.        Cognition and Memory: Cognition is impaired. Memory is impaired.        Judgment: Judgment is impulsive.     Comments: Abnormal speech (articulation) difficult to understand. Short attention span, good mood at this time but can escalate quickly for no reason.        Review of Systems  Unable to perform ROS: Other (Intellectual ability)    Blood pressure (!) 149/104, pulse (!) 102, temperature 98.1 F (36.7 C), temperature source Oral, resp. rate 16, SpO2 99 %.There is no height or weight on file to calculate BMI.  General Appearance: Casual  Eye Contact:  Good  Speech:  verbal, abnormal articulation  Volume:  Approriate  Mood:  Anxious  Affect:  Appropriate and Congruent  Thought Process:  Linear and Descriptions of Associations: Intact,  Intellectual disability, autistic disorder, child like (Unable to properly assess)   Orientation:  Other:  Person (self)  Thought Content:  Appropriate to his degree of ASD  Suicidal Thoughts:  No  Homicidal Thoughts:  No  Memory:  Immediate;   Poor Recent;   Poor  Judgement:  Other:  Impaired  Insight:  Poor  Psychomotor Activity:  Increased  Concentration:  Concentration: Poor and Attention Span: Poor  Recall:  Poor  Fund of Knowledge:  Poor  Language:  Poor  Akathisia:  No  Handed:  Right  AIMS (if indicated):     Assets:  Housing Social Support  ADL's:  Intact  Cognition:  Impaired,  Severe  Sleep:       Treatment Plan Summary: Daily contact with patient, and medication management. [place] has submitted a notice to surrender therapuetic services at this time due to the safety  of self and others. Patient continues to display aggressive behaviors, and remains restless  while in the ER. WIll continue to recommend Inpatient admission at this time, while working closely alongside with care coordinator team to find placement and new outpatient services.    Disposition:  Social work to continue working with [facility] [facility] for placement Will continue with current treatment plan; no changes at this time  [employer], NP 02/04/2020, 11:00 AM

## 2020-02-04 NOTE — BH Assessment (Signed)
BHH Assessment Progress Note  Per Shuvon Rankin, FNP, this pt is progressing toward baseline, but would still benefit from psychiatric hospitalization at this time.  Pt remains under IVC re-initiated by Nelly Rout, MD on 02/03/2020.  This Clinical research associate continues to seek placement for pt, as well as to reach out to [employer], pt's [facility], on a daily basis.  At 08:40 I called [facility].  They report that their IDD unit remains at capacity today.  At 11:29 I called [name], updating her on pt's status and dispositional needs.  She reports that pt has been declined by the ICF's operated by [facility] and by [facility], but she has applied to similar facilities, as well as to [facility], with decisions pending.  [name] reports that [facility] has still not informed her who the worker is that has been assigned to the pt's case; she will continue to pursue this.  She also notes that a court date for pt's guardianship has not yet been scheduled.  Before concluding call I made sure that she has contact information for me, including my office hours, as well as after hours contacts for any other details that she may need.  Final disposition is pending as of this writing.  Doylene Canning, [place] Behavioral Health Coordinator [phone]

## 2020-02-04 NOTE — ED Notes (Addendum)
BHH Assessment Progress Note  Per Archana Kumar, MD, this pt continues to require psychiatric hospitalization at this time.  Pt's IVC expires today, but Dr Kumar finds that pt continues to meet criteria for IVC, which she has re-initiated.  IVC documents have been faxed to [place] Magistrate, and at 12:57 Magistrate Watts confirms receipt.  He has since faxed Findings and Custody Order to this writer.  At 13:10 I called [employer] and spoke to Operator Angela, who took demographic information, agreeing to dispatch law enforcement to fill out Return of Service.  As of this writing, arrival of law enforcement is pending.  At 13:00 I received a call from Carrie Archer, pt's [facility] Care Coordinator.  I updated her on pt's behavior and disposition.  She reports that pt has been assigned an [facility] care coordinator, although she does not have the name or contact information for the individual.  She will be asking the worker to come to [facility] to evaluate pt for assistance with stabilizing and dispositioning him.  In the meantime, Carrie continues to seek placement for the pt in the community, including referring him to [facility].  She reports that Noel Turnbull has filed paperwork with the court to be released from guardianship.  Court date is pending at this time.  At 13:25 I called [facility].  They report that their IDD unit is currently at capacity.  Thomas Hughes, MA Behavioral Health Coordinator [phone]      

## 2020-02-04 NOTE — ED Notes (Signed)
Renaye Rakers NP  contacted related to pt out of controlled behavior. Pt heard over phone screaming and yelling. Medication regime reviewed with her and order for 20 mg Geodon PRN obtained.

## 2020-02-05 DIAGNOSIS — R4689 Other symptoms and signs involving appearance and behavior: Secondary | ICD-10-CM

## 2020-02-05 DIAGNOSIS — F840 Autistic disorder: Secondary | ICD-10-CM | POA: Diagnosis not present

## 2020-02-05 NOTE — Progress Notes (Addendum)
02/05/2020  0950  Patient was listening to music then all of a sudden patient began yelling, biting hand, and banging on the walls. PRN meds given.

## 2020-02-05 NOTE — Consult Note (Addendum)
[facility] Psych ED Progress Note  02/05/2020 12:38 PM Jonathan Bird  MRN:  [id number]   Patient reassessed and case discussed with nursing staff. Patient is a 29 year old male who was brought in by [facility] under IVC by guardian with reports of aggression and behavioral disturbances.   Follow Up Assessment: Jonathan Bird, 29 y.o., male patient seen via tele psych for psychiatric follow up by this provider, Dr. Dwyane Dee; and chart reviewed on 02/05/20.  On evaluation Jonathan Bird is standing in the exam room, music is playing and patient is seen dancing.  Patient is alert/oriented to self, he greets this Probation officer by waving and saying "hi".  He is currently calm/cooperative and in a pleasant mood smiling and singing. Patient does not appear to be responding to internal/external stimuli.  Patient with limited verbal ability but able to communicate and make most of his needs known.  Patient asked about his breakfast and replies that he had "pancakes" this morning. Patient asked what kind of music he likes, he replied by grabbing his paper and pointing to it while stating "happy birthday, repeatedly."    Collateral information received from nursing notes and staff.  The patient was given prn Geodon last night for yelling and screaming.  He has been excited and hyper this morning but no behavioral concerns requiring redirection thus far. Patient continues to have episodes of agitation or eruptive behaviors that requires medication to calm him.  His agitation/behavior seem to wax with abrupt environmental changes in the context of dx of Autism, IQ 40 alcohol syndrome and impaired communications.  Since admission several weeks prior, he demonstrates symptomatic improvement.  He does not demonstrate acute psychiatric concerns that requires continued stay at in the emergency room.  He would benefit from a long term placement with environmental stability    Social work continues to work with [employer] from placement.   Psychiatry will continue to monitor until placement is found.  Patient appears to be at his baseline much better with placement out of the ED where he is not limited.    Principal Problem: Aggression Diagnosis:  Principal Problem:   Aggression Active Problems:   Autism disorder   Fetal alcohol syndrome   Intellectual disability  Total Time spent with patient: 15 minutes  Past Psychiatric History: Autism spectrum disorder, fetal alcohol syndrome, intellectual disorder   Past Medical History:  Past Medical History:  Diagnosis Date  . Autism    outbursts  . Autism spectrum   . Bipolar disorder ([place])   . Diabetes mellitus without complication ([place])    boarderline  . Hypertension     Past Surgical History:  Procedure Laterality Date  . NO PAST SURGERIES    . TOOTH EXTRACTION N/A 05/25/2014   Procedure: EXTRACTION WISDOM TEETH, Extraction teeth # 1, 16, 17, 32.;  Surgeon: Gae Bon, DDS;  Location: [facility];  Service: Oral Surgery;  Laterality: N/A;   Family History: History reviewed. No pertinent family history. Family Psychiatric  History: Unaware Social History:  Social History   Substance and Sexual Activity  Alcohol Use No     Social History   Substance and Sexual Activity  Drug Use No    Social History   Socioeconomic History  . Marital status: Single    Spouse name: Not on file  . Number of children: Not on file  . Years of education: Not on file  . Highest education level: Not on file  Occupational History  . Not  on file  Tobacco Use  . Smoking status: Never Smoker  . Smokeless tobacco: Never Used  Substance and Sexual Activity  . Alcohol use: No  . Drug use: No  . Sexual activity: Never  Other Topics Concern  . Not on file  Social History Narrative  . Not on file   Social Determinants of Health   Financial Resource Strain:   . Difficulty of Paying Living Expenses: Not on file  Food Insecurity:   . Worried About Programme researcher, broadcasting/film/video in the Last  Year: Not on file  . Ran Out of Food in the Last Year: Not on file  Transportation Needs:   . Lack of Transportation (Medical): Not on file  . Lack of Transportation (Non-Medical): Not on file  Physical Activity:   . Days of Exercise per Week: Not on file  . Minutes of Exercise per Session: Not on file  Stress:   . Feeling of Stress : Not on file  Social Connections:   . Frequency of Communication with Friends and Family: Not on file  . Frequency of Social Gatherings with Friends and Family: Not on file  . Attends Religious Services: Not on file  . Active Member of Clubs or Organizations: Not on file  . Attends Banker Meetings: Not on file  . Marital Status: Not on file    Sleep: Good  Appetite:  Good  Current Medications: Current Facility-Administered Medications  Medication Dose Route Frequency Provider Last Rate Last Admin  . amantadine (SYMMETREL) capsule 100 mg  100 mg Oral BID Fayrene Helper, PA-C   100 mg at 02/05/20 4967  . carbamazepine (TEGRETOL) chewable tablet 200 mg  200 mg Oral BID Rankin, Shuvon B, NP   200 mg at 02/05/20 0954  . chlorproMAZINE (THORAZINE) injection 25 mg  25 mg Intramuscular TID PRN Rankin, Shuvon B, NP   25 mg at 02/05/20 0953  . chlorproMAZINE (THORAZINE) tablet 100 mg  100 mg Oral TID Fayrene Helper, PA-C   100 mg at 02/05/20 5916  . lidocaine-EPINEPHrine-tetracaine (LET) topical gel  3 mL Topical Once Fayrene Helper, PA-C      . loratadine (CLARITIN) tablet 10 mg  10 mg Oral Daily Fayrene Helper, PA-C   10 mg at 02/05/20 0954  . LORazepam (ATIVAN) tablet 2.5 mg  2.5 mg Oral Q4H PRN Fayrene Helper, PA-C   2.5 mg at 02/05/20 0954  . mirtazapine (REMERON) tablet 45 mg  45 mg Oral QHS Fayrene Helper, PA-C   45 mg at 02/04/20 2130   Current Outpatient Medications  Medication Sig Dispense Refill  . amantadine (SYMMETREL) 100 MG capsule Take 100 mg by mouth 2 (two) times daily.    . carbamazepine (TEGRETOL) 200 MG tablet Take 200 mg by mouth 2 (two)  times daily.    . cetirizine (ZYRTEC) 10 MG tablet Take 10 mg by mouth daily.    . chlorproMAZINE (THORAZINE) 100 MG tablet Take 100 mg by mouth 3 (three) times daily.    . hydrOXYzine (ATARAX/VISTARIL) 25 MG tablet Take 25 mg by mouth 3 (three) times daily.     Marland Kitchen LORazepam (ATIVAN) 2 MG tablet Take 2.5 mg by mouth 2 (two) times daily.     . mirtazapine (REMERON) 30 MG tablet Take 45 mg by mouth at bedtime. Mat give second dose if awake in night    . fluticasone (FLONASE) 50 MCG/ACT nasal spray Place 2 sprays into the nose daily. 2 sprays to each nostril at bedtime. (Patient  not taking: Reported on 01/25/2020) 16 g 2  . oxyCODONE-acetaminophen (PERCOCET) 5-325 MG per tablet Take 2 tablets by mouth every 4 (four) hours as needed for severe pain. (Patient not taking: Reported on 01/21/2020) 40 tablet 0    Lab Results: No results found for this or any previous visit (from the past 48 hour(s)).  Blood Alcohol level:  Lab Results  Component Value Date   ETH <10 01/25/2020   ETH <10 01/21/2020    Musculoskeletal: Strength & Muscle Tone: within normal limits Gait & Station: normal Patient leans: N/A  Psychiatric Specialty Exam: Physical Exam Vitals and nursing note reviewed. Exam conducted with a chaperone present Air cabin crew at bedside).  Pulmonary:     Effort: Pulmonary effort is normal.  Neurological:     Mental Status: He is alert.  Psychiatric:        Thought Content: Thought content does not include homicidal or suicidal ideation.        Cognition and Memory: Cognition is impaired. Memory is impaired.        Judgment: Judgment is impulsive.     Comments: Abnormal speech (articulation) difficult to understand. Short attention span, good mood at this time but can escalate quickly for no reason.        Review of Systems  Unable to perform ROS: Other (Intellectual ability)  Psychiatric/Behavioral: Negative for behavioral problems (not present at the time of assessment) and self-injury.  The patient is hyperactive (ongoing).     Blood pressure (!) 148/94, pulse (!) 111, temperature 98.3 F (36.8 C), temperature source Oral, resp. rate 20, SpO2 100 %.There is no height or weight on file to calculate BMI.  General Appearance: Casual  Eye Contact:  Good  Speech:  verbal, abnormal articulation  Volume:  Approriate  Mood:  Anxious  Affect:  Appropriate and Congruent  Thought Process:  Linear and Descriptions of Associations: Intact,  Intellectual disability, autistic disorder, child like (Unable to properly assess)   Orientation:  Other:  Person (self)  Thought Content:  Appropriate to his degree of ASD  Suicidal Thoughts:  No  Homicidal Thoughts:  No  Memory:  Immediate;   Poor Recent;   Poor  Judgement:  Other:  Impaired  Insight:  Poor  Psychomotor Activity:  Increased  Concentration:  Concentration: Poor and Attention Span: Poor  Recall:  Poor  Fund of Knowledge:  Poor  Language:  Poor  Akathisia:  No  Handed:  Right  AIMS (if indicated):     Assets:  Housing Social Support  ADL's:  Intact  Cognition:  Impaired,  Severe  Sleep:   >6 hours    Treatment Plan Summary: Daily contact with patient, and medication management. Guardian has submitted a notice to surrender therapuetic services at this time due to the safety of self and others. Patient's aggressive behaviors are intermittent, and decreased since admission.  WIll continue to recommend inpatient admission at this time, while working closely alongside with care coordinator team to find placement and new outpatient services.    Disposition:  Social work to continue working with [facility] [facility] for placement Will continue with current treatment plan; no changes at this time   Chales Abrahams, NP 02/05/2020, 12:38 PM

## 2020-02-05 NOTE — BH Assessment (Signed)
[facility] Assessment Progress Note  At 10:44 this Clinical research associate called [name].  I reported that [facility] still does not have beds available on their IDD unit.  She reports that there are several residential options that are currently considering pt, including one mentioned yesterday that may wish to arrange a video screening.  This is still pending, and [name] agrees to provide as much advanced notice as possible to coordinate video support with Geralyn Corwin, CSW. [name] reports that pt's newly assigned [employer] worker is Software engineer.  She is scheduled for an intake call with [name] on Monday, 02/09/20.    At this time pt remains under IVC.  Final disposition is pending.  Doylene Canning, [place] [facility]al [facility] Coordinator [phone]

## 2020-02-06 DIAGNOSIS — F840 Autistic disorder: Secondary | ICD-10-CM | POA: Diagnosis not present

## 2020-02-06 MED ORDER — ZIPRASIDONE MESYLATE 20 MG IM SOLR
20.0000 mg | Freq: Once | INTRAMUSCULAR | Status: AC
  Administered 2020-02-06: 22:00:00 20 mg via INTRAMUSCULAR

## 2020-02-06 MED ORDER — STERILE WATER FOR INJECTION IJ SOLN
INTRAMUSCULAR | Status: AC
  Administered 2020-02-06: 10 mL
  Filled 2020-02-06: qty 10

## 2020-02-06 MED ORDER — ZIPRASIDONE MESYLATE 20 MG IM SOLR
INTRAMUSCULAR | Status: AC
  Filled 2020-02-06: qty 20

## 2020-02-06 NOTE — ED Notes (Signed)
Pt has been happy today. He got pizza for lunch which was his requested. He ate that, french fries and cake and was so happy that he said thank you, good job and good night. He went to bed for a nap.

## 2020-02-06 NOTE — ED Provider Notes (Signed)
  Physical Exam  BP 134/90 (BP Location: Right Arm)   Pulse 99   Temp 97.9 F (36.6 C)   Resp 18   SpO2 99%   Physical Exam  ED Course/Procedures     Procedures  MDM  Patient running around and out of control.  Has required Geodon for this in the past.  Reviewing records and discussed with nursing.  They state that the Thorazine as needed does not help.  Will give Geodon.      Benjiman Core, MD 02/06/20 2126

## 2020-02-06 NOTE — BH Assessment (Addendum)
[facility] Assessment Progress Note  Per Berneice Heinrich, FNP pt continues to need placement.  He remains under IVC initiated by Nelly Rout, MD, but can be released from IVC if placement can be found in the community at a facility that can address his needs and assure his safety.   Video screening arranged by [name] took place at 14:00.  This Clinical research associate was in attendance along with [name], the pt, Geralyn Corwin, CSW, and two member of the staff at a facility that is considering pt for admission, including Teressa Lower.  Pt remained calm and cooperative during assessment.  He stayed on topic during discussion, appropriately responding to questions, and was goal directed.  Mood was pleasant with bright affect.  He was oriented to person, and could identify his birthday when asked.  The facility staff concluded their questioning, and then Novia and I responded to other questions after leaving pt's room.  We informed them of pt's behavior, noting that during the video screening another pt in an adjacent room was escalating, but that pt remained calm.  We reviewed pt's current medication regimen as found in pt's MAR.  The interview then came to a conclusion.  As of this writing pt's final disposition is pending.  Doylene Canning, [place] [facility] Coordinator [phone]   Addendum:  At 15:48 [place] calls.  She reports that the group home that interviewed pt believes that they will probably be able to accommodate him.  They have not, however, committed to taking him yet, but will begin paperwork to facilitate admitting him.  [name] reports that an FL-2 will probably be needed at some point.  Process will resume after the coming weekend.  Doylene Canning, [place] Behavior[facility] Health Coordinator [phone]

## 2020-02-06 NOTE — ED Notes (Signed)
Linens changed while pt in shower.

## 2020-02-06 NOTE — Consult Note (Signed)
[facility] Psych ED Progress Note  02/06/2020 2:59 PM Jonathan Bird  MRN:  [id number] Subjective:  Patient states "I had waffles for breakfast." Principal Problem: Aggression Diagnosis:  Principal Problem:   Aggression Active Problems:   Autism disorder   Fetal alcohol syndrome   Intellectual disability  Total Time spent with patient: 30 minutes  Past Psychiatric History: Autism spectrum disorder, fetal alcohol syndrome, intellectual disorder  Past Medical History:  Past Medical History:  Diagnosis Date  . Autism    outbursts  . Autism spectrum   . Bipolar disorder (HCC)   . Diabetes mellitus without complication (HCC)    boarderline  . Hypertension     Past Surgical History:  Procedure Laterality Date  . NO PAST SURGERIES    . TOOTH EXTRACTION N/A 05/25/2014   Procedure: EXTRACTION WISDOM TEETH, Extraction teeth # 1, 16, 17, 32.;  Surgeon: Georgia Lopes, DDS;  Location: [facility] OR;  Service: Oral Surgery;  Laterality: N/A;   Family History: History reviewed. No pertinent family history. Family Psychiatric  History: Unknown Social History:  Social History   Substance and Sexual Activity  Alcohol Use No     Social History   Substance and Sexual Activity  Drug Use No    Social History   Socioeconomic History  . Marital status: Single    Spouse name: Not on file  . Number of children: Not on file  . Years of education: Not on file  . Highest education level: Not on file  Occupational History  . Not on file  Tobacco Use  . Smoking status: Never Smoker  . Smokeless tobacco: Never Used  Substance and Sexual Activity  . Alcohol use: No  . Drug use: No  . Sexual activity: Never  Other Topics Concern  . Not on file  Social History Narrative  . Not on file   Social Determinants of Health   Financial Resource Strain:   . Difficulty of Paying Living Expenses: Not on file  Food Insecurity:   . Worried About Programme researcher, broadcasting/film/video in the Last Year: Not on file  . Ran  Out of Food in the Last Year: Not on file  Transportation Needs:   . Lack of Transportation (Medical): Not on file  . Lack of Transportation (Non-Medical): Not on file  Physical Activity:   . Days of Exercise per Week: Not on file  . Minutes of Exercise per Session: Not on file  Stress:   . Feeling of Stress : Not on file  Social Connections:   . Frequency of Communication with Friends and Family: Not on file  . Frequency of Social Gatherings with Friends and Family: Not on file  . Attends Religious Services: Not on file  . Active Member of Clubs or Organizations: Not on file  . Attends Banker Meetings: Not on file  . Marital Status: Not on file    Sleep: Good  Appetite:  Good  Current Medications: Current Facility-Administered Medications  Medication Dose Route Frequency Provider Last Rate Last Admin  . amantadine (SYMMETREL) capsule 100 mg  100 mg Oral BID Fayrene Helper, PA-C   100 mg at 02/06/20 2355  . carbamazepine (TEGRETOL) chewable tablet 200 mg  200 mg Oral BID Rankin, Shuvon B, NP   200 mg at 02/06/20 0918  . chlorproMAZINE (THORAZINE) injection 25 mg  25 mg Intramuscular TID PRN Rankin, Shuvon B, NP   25 mg at 02/05/20 1854  . chlorproMAZINE (THORAZINE) tablet 100 mg  100 mg Oral TID Domenic Moras, PA-C   100 mg at 02/06/20 1191  . lidocaine-EPINEPHrine-tetracaine (LET) topical gel  3 mL Topical Once Domenic Moras, PA-C      . loratadine (CLARITIN) tablet 10 mg  10 mg Oral Daily Domenic Moras, PA-C   10 mg at 02/06/20 4782  . LORazepam (ATIVAN) tablet 2.5 mg  2.5 mg Oral Q4H PRN Domenic Moras, PA-C   2.5 mg at 02/06/20 1334  . mirtazapine (REMERON) tablet 45 mg  45 mg Oral QHS Domenic Moras, PA-C   45 mg at 02/04/20 2130   Current Outpatient Medications  Medication Sig Dispense Refill  . amantadine (SYMMETREL) 100 MG capsule Take 100 mg by mouth 2 (two) times daily.    . carbamazepine (TEGRETOL) 200 MG tablet Take 200 mg by mouth 2 (two) times daily.    . cetirizine  (ZYRTEC) 10 MG tablet Take 10 mg by mouth daily.    . chlorproMAZINE (THORAZINE) 100 MG tablet Take 100 mg by mouth 3 (three) times daily.    . hydrOXYzine (ATARAX/VISTARIL) 25 MG tablet Take 25 mg by mouth 3 (three) times daily.     Marland Kitchen LORazepam (ATIVAN) 2 MG tablet Take 2.5 mg by mouth 2 (two) times daily.     . mirtazapine (REMERON) 30 MG tablet Take 45 mg by mouth at bedtime. Mat give second dose if awake in night    . fluticasone (FLONASE) 50 MCG/ACT nasal spray Place 2 sprays into the nose daily. 2 sprays to each nostril at bedtime. (Patient not taking: Reported on 01/25/2020) 16 g 2  . oxyCODONE-acetaminophen (PERCOCET) 5-325 MG per tablet Take 2 tablets by mouth every 4 (four) hours as needed for severe pain. (Patient not taking: Reported on 01/21/2020) 40 tablet 0    Lab Results: No results found for this or any previous visit (from the past 48 hour(s)).  Blood Alcohol level:  Lab Results  Component Value Date   ETH <10 01/25/2020   ETH <10 01/21/2020    Physical Findings: AIMS:  , ,  ,  ,    CIWA:    COWS:     Musculoskeletal: Strength & Muscle Tone: within normal limits Gait & Station: normal Patient leans: N/A  Psychiatric Specialty Exam: Physical Exam Vitals and nursing note reviewed.  Constitutional:      Appearance: He is well-developed.  HENT:     Head: Normocephalic.  Cardiovascular:     Rate and Rhythm: Normal rate.  Pulmonary:     Effort: Pulmonary effort is normal.  Neurological:     Mental Status: He is alert.  Psychiatric:        Behavior: Behavior normal.     Review of Systems  Constitutional: Negative.   HENT: Negative.   Eyes: Negative.   Respiratory: Negative.   Cardiovascular: Negative.   Gastrointestinal: Negative.   Genitourinary: Negative.   Musculoskeletal: Negative.   Skin: Negative.   Neurological: Negative.     Blood pressure (!) 139/91, pulse (!) 112, temperature (!) 97.5 F (36.4 C), temperature source Oral, resp. rate 18,  SpO2 99 %.There is no height or weight on file to calculate BMI.  General Appearance: Fairly Groomed  Eye Contact:  Good  Speech:  Slow  Volume:  Normal  Mood:  Anxious  Affect:  Congruent  Thought Process:  Goal oriented   Orientation:  Other:  self only  Thought Content:  WDL  Suicidal Thoughts:  No  Homicidal Thoughts:  No  Memory:  Immediate;  Fair  Judgement:  Impaired  Insight:  Lacking  Psychomotor Activity:  Normal  Concentration:  Concentration: Fair  Recall:  Poor  Fund of Knowledge:  Poor  Language:  Poor  Akathisia:  No  Handed:  Right  AIMS (if indicated):     Assets:  Leisure Time Social Print production planner  ADL's:  Intact  Cognition:  Impaired,  Severe  Sleep:         Treatment Plan Summary: Daily contact with patient to assess and evaluate symptoms and progress in treatment.  Inpatient psychiatric treatment recommended.  Patient's behavior continues to improve.  New placement continues to be a barrier to this patient's discharge.  Patrcia Dolly, FNP 02/06/2020, 2:59 PM

## 2020-02-06 NOTE — ED Notes (Signed)
Pt in bed sleeping

## 2020-02-06 NOTE — Progress Notes (Signed)
Received Jonathan Bird at the change of shift in his room screaming with loud inaudible sounds. Security was at the door waiting for him to calm down after being medicated. He continued with the disorderly behavior for one hour before he was able to calm himself down. The sitter remained at the bedside. He slept throughout the night and woke up at 0500 hrs, showered and brushed his teeth without incident.

## 2020-02-06 NOTE — ED Notes (Signed)
Pt is very fastidious about his hygiene.

## 2020-02-06 NOTE — ED Notes (Signed)
Pt has been cooperative and behavior is his baseline. Care managed today by giving pt as many choices as possible to make his life more enjoyable. Allowed him to select some of the food items to appear on his tray. He comes out of his room sometimes, but redirects back. Pt has limited understanding of his situation. Likes to sing and listen to music. Likes to color and put the finished art on his wall.

## 2020-02-06 NOTE — ED Notes (Signed)
Pt cleans his room and keeps it neat. Wipes down his tray table. He just ask to take a shower.  He came up to the nurse station and said, "Take shower."

## 2020-02-07 DIAGNOSIS — F840 Autistic disorder: Secondary | ICD-10-CM | POA: Diagnosis not present

## 2020-02-07 MED ORDER — ZIPRASIDONE MESYLATE 20 MG IM SOLR
20.0000 mg | Freq: Once | INTRAMUSCULAR | Status: AC
  Administered 2020-02-07: 20 mg via INTRAMUSCULAR
  Filled 2020-02-07: qty 20

## 2020-02-07 MED ORDER — STERILE WATER FOR INJECTION IJ SOLN
INTRAMUSCULAR | Status: AC
  Filled 2020-02-07: qty 10

## 2020-02-07 MED ORDER — DIPHENHYDRAMINE HCL 50 MG/ML IJ SOLN
50.0000 mg | Freq: Once | INTRAMUSCULAR | Status: AC
  Administered 2020-02-07: 50 mg via INTRAMUSCULAR
  Filled 2020-02-07: qty 1

## 2020-02-07 MED ORDER — LORAZEPAM 2 MG/ML IJ SOLN
2.0000 mg | Freq: Once | INTRAMUSCULAR | Status: AC
  Administered 2020-02-07: 2 mg via INTRAMUSCULAR
  Filled 2020-02-07: qty 1

## 2020-02-07 NOTE — ED Notes (Signed)
Restraints were removed so that patient could rest comfortably.  He immediately go out of bed.  He is calm at this time.

## 2020-02-07 NOTE — ED Provider Notes (Signed)
Blood pressure (!) 140/97, pulse (!) 105, temperature 97.9 F (36.6 C), resp. rate 19, SpO2 100 %.  In short, Jonathan Bird is a 29 y.o. male with a chief complaint of Behavioral episode and Autistic .  Refer to the original H&P for additional details.  Called to bedside with patient acting aggressively towards staff.  He is shouting and taking aggressive posturing.  Unable to redirect him verbally.  Patient required Geodon, Benadryl, Ativan.  He was placed in restraints and continued to attempt to throw himself out of bed. Ativan was added and patient eventually clamed down. No obvious injury.   CRITICAL CARE Performed by: Maia Plan Total critical care time: 35 minutes Critical care time was exclusive of separately billable procedures and treating other patients. Critical care was necessary to treat or prevent imminent or life-threatening deterioration. Critical care was time spent personally by me on the following activities: development of treatment plan with patient and/or surrogate as well as nursing, discussions with consultants, evaluation of patient's response to treatment, examination of patient, obtaining history from patient or surrogate, ordering and performing treatments and interventions, ordering and review of laboratory studies, ordering and review of radiographic studies, pulse oximetry and re-evaluation of patient's condition.  Alona Bene, MD Emergency Medicine     Aubreana Cornacchia, Arlyss Repress, MD 02/08/20 [phone]

## 2020-02-07 NOTE — Consult Note (Addendum)
[facility] Psych ED Progress Note  02/07/2020 12:05 PM Jonathan Bird  MRN:  [id number] Subjective:  Patient states "I am doing good." Principal Problem: Aggression Diagnosis:  Principal Problem:   Aggression Active Problems:   Autism disorder   Fetal alcohol syndrome   Intellectual disability  Total Time spent with patient: 20 minutes  Past Psychiatric History: Autism spectrum disorder, fetal alcohol syndrome, intellectual disorder  Past Medical History:  Past Medical History:  Diagnosis Date  . Autism    outbursts  . Autism spectrum   . Bipolar disorder ([place])   . Diabetes mellitus without complication ([place])    boarderline  . Hypertension     Past Surgical History:  Procedure Laterality Date  . NO PAST SURGERIES    . TOOTH EXTRACTION N/A 05/25/2014   Procedure: EXTRACTION WISDOM TEETH, Extraction teeth # 1, 16, 17, 32.;  Surgeon: Gae Bon, DDS;  Location: [place];  Service: Oral Surgery;  Laterality: N/A;   Family History: History reviewed. No pertinent family history. Family Psychiatric  History: Unknown Social History:  Social History   Substance and Sexual Activity  Alcohol Use No     Social History   Substance and Sexual Activity  Drug Use No    Social History   Socioeconomic History  . Marital status: Single    Spouse name: Not on file  . Number of children: Not on file  . Years of education: Not on file  . Highest education level: Not on file  Occupational History  . Not on file  Tobacco Use  . Smoking status: Never Smoker  . Smokeless tobacco: Never Used  Substance and Sexual Activity  . Alcohol use: No  . Drug use: No  . Sexual activity: Never  Other Topics Concern  . Not on file  Social History Narrative  . Not on file   Social Determinants of Health   Financial Resource Strain:   . Difficulty of Paying Living Expenses: Not on file  Food Insecurity:   . Worried About Charity fundraiser in the Last Year: Not on file  . Ran Out of Food  in the Last Year: Not on file  Transportation Needs:   . Lack of Transportation (Medical): Not on file  . Lack of Transportation (Non-Medical): Not on file  Physical Activity:   . Days of Exercise per Week: Not on file  . Minutes of Exercise per Session: Not on file  Stress:   . Feeling of Stress : Not on file  Social Connections:   . Frequency of Communication with Friends and Family: Not on file  . Frequency of Social Gatherings with Friends and Family: Not on file  . Attends Religious Services: Not on file  . Active Member of Clubs or Organizations: Not on file  . Attends Archivist Meetings: Not on file  . Marital Status: Not on file    Sleep: Good  Appetite:  Good  Current Medications: Current Facility-Administered Medications  Medication Dose Route Frequency Provider Last Rate Last Admin  . amantadine (SYMMETREL) capsule 100 mg  100 mg Oral BID Domenic Moras, PA-C   100 mg at 02/07/20 1005  . carbamazepine (TEGRETOL) chewable tablet 200 mg  200 mg Oral BID Rankin, Shuvon B, NP   200 mg at 02/07/20 1006  . chlorproMAZINE (THORAZINE) injection 25 mg  25 mg Intramuscular TID PRN Rankin, Shuvon B, NP   25 mg at 02/05/20 1854  . chlorproMAZINE (THORAZINE) tablet 100 mg  100 mg Oral TID Fayrene Helper, PA-C   100 mg at 02/07/20 1006  . lidocaine-EPINEPHrine-tetracaine (LET) topical gel  3 mL Topical Once Fayrene Helper, PA-C      . loratadine (CLARITIN) tablet 10 mg  10 mg Oral Daily Fayrene Helper, PA-C   10 mg at 02/07/20 1006  . LORazepam (ATIVAN) tablet 2.5 mg  2.5 mg Oral Q4H PRN Fayrene Helper, PA-C   2.5 mg at 02/07/20 1054  . mirtazapine (REMERON) tablet 45 mg  45 mg Oral QHS Fayrene Helper, PA-C   45 mg at 02/06/20 2052   Current Outpatient Medications  Medication Sig Dispense Refill  . amantadine (SYMMETREL) 100 MG capsule Take 100 mg by mouth 2 (two) times daily.    . carbamazepine (TEGRETOL) 200 MG tablet Take 200 mg by mouth 2 (two) times daily.    . cetirizine (ZYRTEC) 10  MG tablet Take 10 mg by mouth daily.    . chlorproMAZINE (THORAZINE) 100 MG tablet Take 100 mg by mouth 3 (three) times daily.    . hydrOXYzine (ATARAX/VISTARIL) 25 MG tablet Take 25 mg by mouth 3 (three) times daily.     Marland Kitchen LORazepam (ATIVAN) 2 MG tablet Take 2.5 mg by mouth 2 (two) times daily.     . mirtazapine (REMERON) 30 MG tablet Take 45 mg by mouth at bedtime. Mat give second dose if awake in night    . fluticasone (FLONASE) 50 MCG/ACT nasal spray Place 2 sprays into the nose daily. 2 sprays to each nostril at bedtime. (Patient not taking: Reported on 01/25/2020) 16 g 2  . oxyCODONE-acetaminophen (PERCOCET) 5-325 MG per tablet Take 2 tablets by mouth every 4 (four) hours as needed for severe pain. (Patient not taking: Reported on 01/21/2020) 40 tablet 0    Lab Results: No results found for this or any previous visit (from the past 48 hour(s)).  Blood Alcohol level:  Lab Results  Component Value Date   ETH <10 01/25/2020   ETH <10 01/21/2020    Physical Findings: AIMS:  , ,  ,  ,    CIWA:    COWS:     Musculoskeletal: Strength & Muscle Tone: within normal limits Gait & Station: normal Patient leans: N/A  Psychiatric Specialty Exam: Physical Exam Vitals and nursing note reviewed.  Constitutional:      Appearance: He is well-developed.  HENT:     Head: Normocephalic.  Cardiovascular:     Rate and Rhythm: Normal rate.  Pulmonary:     Effort: Pulmonary effort is normal.  Neurological:     Mental Status: He is alert.  Psychiatric:        Behavior: Behavior normal.     Review of Systems  Constitutional: Negative.   HENT: Negative.   Eyes: Negative.   Respiratory: Negative.   Cardiovascular: Negative.   Gastrointestinal: Negative.   Genitourinary: Negative.   Musculoskeletal: Negative.   Skin: Negative.   Neurological: Negative.     Blood pressure (!) 140/97, pulse (!) 105, temperature 97.9 F (36.6 C), resp. rate 19, SpO2 100 %.There is no height or weight on  file to calculate BMI.  General Appearance: Fairly Groomed  Eye Contact:  Good  Speech:  Slow  Volume:  Normal  Mood:  Anxious  Affect:  Congruent  Thought Process:  Goal oriented   Orientation:  Other:  self only  Thought Content:  WDL  Suicidal Thoughts:  No  Homicidal Thoughts:  No  Memory:  Immediate;   Fair  Judgement:  Impaired  Insight:  Lacking  Psychomotor Activity:  Normal  Concentration:  Concentration: Fair  Recall:  Poor  Fund of Knowledge:  Poor  Language:  Poor  Akathisia:  No  Handed:  Right  AIMS (if indicated):     Assets:  Leisure Time Social Support Transportation  ADL's:  Intact  Cognition:  Impaired,  Severe  Sleep:         Treatment Plan Summary: Patient appears more cooperative today. Patient participates as his ability allows in assessment. Daily contact with patient to assess and evaluate symptoms and progress in treatment.  Inpatient psychiatric treatment recommended.  Patient's behavior continues to improve.  New placement continues to be a barrier to this patient's discharge.  Patrcia Dolly, FNP 02/07/2020, 12:05 PM  Patient seen face-to-face for psychiatric evaluation, chart reviewed and case discussed with the physician extender and developed treatment plan. Reviewed the information documented and agree with the treatment plan. Thedore Mins, MD

## 2020-02-07 NOTE — ED Provider Notes (Signed)
Emergency Medicine Observation Re-evaluation Note  Jonathan Bird is a 29 y.o. male, seen on rounds today.  Pt initially presented to the ED for complaints of Behavioral episode and Autistic Currently, the patient is under IVC, being held for group home placement.  No acute events overnight. Patient alert, pacing in room on exam today.  Physical Exam  BP (!) 140/97 (BP Location: Right Arm)   Pulse (!) 105   Temp 97.9 F (36.6 C)   Resp 19   SpO2 100%  Physical Exam Const: alert, mildly agitated, pacing in room HEENT: atraumatic Pulm: normal WOB MSK: no injury Psych: mildly agitated, talking to self ED Course / MDM  EKG:EKG Interpretation  Date/Time:  Monday January 26 2020 12:05:50 EST Ventricular Rate:  110 PR Interval:  166 QRS Duration: 96 QT Interval:  348 QTC Calculation: 470 R Axis:   92 Text Interpretation: Sinus tachycardia Rightward axis Borderline ECG No acute changes No significant change since last tracing Confirmed by Derwood Kaplan [phone]) on 01/26/2020 11:29:47 PM    I have reviewed the labs performed to date as well as medications administered while in observation.  Recent changes in the last 24 hours include SW spoke with a group home yesterday that may be able to take the patient. Awaiting updates from group home today. Plan  Current plan is for ED hold until group home placement. Patient is under full IVC at this time.   Little, Ambrose Finland, MD 02/07/20 1038

## 2020-02-07 NOTE — ED Notes (Signed)
Patient is a little agitated this morning because he wants a shower.  Once he had his shower he was much more cooperative.

## 2020-02-07 NOTE — ED Notes (Signed)
Patient asked for more paper towels and since we had already replaced the roll once today we told him no.  He immediately became very aggressive ,  Screaming, kicking staff,  Trying to hit staff,  Bitting hiself, shoving furniture and computers.  He was also spitting at staff and trying to bite staff.  MD was called.  All security and off duty [employer] were present.

## 2020-02-08 DIAGNOSIS — F840 Autistic disorder: Secondary | ICD-10-CM | POA: Diagnosis not present

## 2020-02-08 MED ORDER — ZIPRASIDONE MESYLATE 20 MG IM SOLR
20.0000 mg | Freq: Once | INTRAMUSCULAR | Status: AC
  Administered 2020-02-08: 20 mg via INTRAMUSCULAR
  Filled 2020-02-08: qty 20

## 2020-02-08 MED ORDER — LORAZEPAM 2 MG/ML IJ SOLN
1.0000 mg | Freq: Once | INTRAMUSCULAR | Status: AC
  Administered 2020-02-08: 1 mg via INTRAMUSCULAR
  Filled 2020-02-08: qty 1

## 2020-02-08 MED ORDER — STERILE WATER FOR INJECTION IJ SOLN
INTRAMUSCULAR | Status: AC
  Filled 2020-02-08: qty 10

## 2020-02-08 MED ORDER — ZIPRASIDONE MESYLATE 20 MG IM SOLR
10.0000 mg | Freq: Once | INTRAMUSCULAR | Status: AC
  Administered 2020-02-08: 10 mg via INTRAMUSCULAR
  Filled 2020-02-08: qty 20

## 2020-02-08 NOTE — ED Notes (Signed)
Pt became agitated and violent, striking at staff requiring arm and leg hard restraints reapplied. Given 10mg  Geodon and 1 mg Ativan IM per MD

## 2020-02-08 NOTE — ED Provider Notes (Signed)
Emergency Medicine Observation Re-evaluation Note  Jonathan Bird is a 29 y.o. male, seen on rounds today.  Pt initially presented to the ED for complaints of Behavioral episode and Autistic Currently, the patient is receiving psych and TOC team reassessments, medication adjustment/management, and facility is being sought for patient.   Physical Exam  BP (!) 152/94 (BP Location: Right Arm)   Pulse (!) 101   Temp 97.7 F (36.5 C) (Axillary)   Resp 18   SpO2 99%  Physical Exam  ED Course / MDM  EKG:EKG Interpretation  Date/Time:  Monday January 26 2020 12:05:50 EST Ventricular Rate:  110 PR Interval:  166 QRS Duration: 96 QT Interval:  348 QTC Calculation: 470 R Axis:   92 Text Interpretation: Sinus tachycardia Rightward axis Borderline ECG No acute changes No significant change since last tracing Confirmed by Derwood Kaplan [phone]) on 01/26/2020 11:29:47 PM    I have reviewed the labs performed to date as well as medications administered while in observation.  Recent changes in the last 24 hours include periods of agitated behavior - currently calm and alert. Plan  Current plan is for continued medication management/adjustment, BH and TOC placement.     Cathren Laine, MD 02/08/20 [phone]

## 2020-02-08 NOTE — ED Notes (Signed)
Pt becoming agitated and screaming, given 2.5mg  PO Ativan and 100mg  Thorazine

## 2020-02-08 NOTE — Consult Note (Addendum)
[facility] Psych ED Progress Note  02/08/2020 10:39 AM ALYN JURNEY  MRN:  [id number] Subjective:  Patient appears with improved mood today. Patient states "I had muffins and jelly for breakfast. I want pink car, white car, toy please."  Principal Problem: Aggression Diagnosis:  Principal Problem:   Aggression Active Problems:   Autism disorder   Fetal alcohol syndrome   Intellectual disability  Total Time spent with patient: 20 minutes  Past Psychiatric History: Autism disorder, fetal alcohol syndrome, intellectual disability  Past Medical History:  Past Medical History:  Diagnosis Date  . Autism    outbursts  . Autism spectrum   . Bipolar disorder (HCC)   . Diabetes mellitus without complication (HCC)    boarderline  . Hypertension     Past Surgical History:  Procedure Laterality Date  . NO PAST SURGERIES    . TOOTH EXTRACTION N/A 05/25/2014   Procedure: EXTRACTION WISDOM TEETH, Extraction teeth # 1, 16, 17, 32.;  Surgeon: Georgia Lopes, DDS;  Location: [facility] OR;  Service: Oral Surgery;  Laterality: N/A;   Family History: History reviewed. No pertinent family history. Family Psychiatric  History: Unknown Social History:  Social History   Substance and Sexual Activity  Alcohol Use No     Social History   Substance and Sexual Activity  Drug Use No    Social History   Socioeconomic History  . Marital status: Single    Spouse name: Not on file  . Number of children: Not on file  . Years of education: Not on file  . Highest education level: Not on file  Occupational History  . Not on file  Tobacco Use  . Smoking status: Never Smoker  . Smokeless tobacco: Never Used  Substance and Sexual Activity  . Alcohol use: No  . Drug use: No  . Sexual activity: Never  Other Topics Concern  . Not on file  Social History Narrative  . Not on file   Social Determinants of Health   Financial Resource Strain:   . Difficulty of Paying Living Expenses: Not on file  Food  Insecurity:   . Worried About Programme researcher, broadcasting/film/video in the Last Year: Not on file  . Ran Out of Food in the Last Year: Not on file  Transportation Needs:   . Lack of Transportation (Medical): Not on file  . Lack of Transportation (Non-Medical): Not on file  Physical Activity:   . Days of Exercise per Week: Not on file  . Minutes of Exercise per Session: Not on file  Stress:   . Feeling of Stress : Not on file  Social Connections:   . Frequency of Communication with Friends and Family: Not on file  . Frequency of Social Gatherings with Friends and Family: Not on file  . Attends Religious Services: Not on file  . Active Member of Clubs or Organizations: Not on file  . Attends Banker Meetings: Not on file  . Marital Status: Not on file    Sleep: Good  Appetite:  Good  Current Medications: Current Facility-Administered Medications  Medication Dose Route Frequency Provider Last Rate Last Admin  . sterile water (preservative free) injection           . amantadine (SYMMETREL) capsule 100 mg  100 mg Oral BID Fayrene Helper, PA-C   100 mg at 02/08/20 1021  . carbamazepine (TEGRETOL) chewable tablet 200 mg  200 mg Oral BID Rankin, Shuvon B, NP   200 mg at 02/08/20  1021  . chlorproMAZINE (THORAZINE) injection 25 mg  25 mg Intramuscular TID PRN Rankin, Shuvon B, NP   25 mg at 02/05/20 1854  . chlorproMAZINE (THORAZINE) tablet 100 mg  100 mg Oral TID Fayrene Helper, PA-C   100 mg at 02/08/20 7425  . lidocaine-EPINEPHrine-tetracaine (LET) topical gel  3 mL Topical Once Fayrene Helper, PA-C      . loratadine (CLARITIN) tablet 10 mg  10 mg Oral Daily Fayrene Helper, PA-C   10 mg at 02/08/20 1021  . LORazepam (ATIVAN) tablet 2.5 mg  2.5 mg Oral Q4H PRN Fayrene Helper, PA-C   2.5 mg at 02/08/20 9563  . mirtazapine (REMERON) tablet 45 mg  45 mg Oral QHS Fayrene Helper, PA-C   45 mg at 02/07/20 2347   Current Outpatient Medications  Medication Sig Dispense Refill  . amantadine (SYMMETREL) 100 MG capsule  Take 100 mg by mouth 2 (two) times daily.    . carbamazepine (TEGRETOL) 200 MG tablet Take 200 mg by mouth 2 (two) times daily.    . cetirizine (ZYRTEC) 10 MG tablet Take 10 mg by mouth daily.    . chlorproMAZINE (THORAZINE) 100 MG tablet Take 100 mg by mouth 3 (three) times daily.    . hydrOXYzine (ATARAX/VISTARIL) 25 MG tablet Take 25 mg by mouth 3 (three) times daily.     Marland Kitchen LORazepam (ATIVAN) 2 MG tablet Take 2.5 mg by mouth 2 (two) times daily.     . mirtazapine (REMERON) 30 MG tablet Take 45 mg by mouth at bedtime. Mat give second dose if awake in night    . fluticasone (FLONASE) 50 MCG/ACT nasal spray Place 2 sprays into the nose daily. 2 sprays to each nostril at bedtime. (Patient not taking: Reported on 01/25/2020) 16 g 2  . oxyCODONE-acetaminophen (PERCOCET) 5-325 MG per tablet Take 2 tablets by mouth every 4 (four) hours as needed for severe pain. (Patient not taking: Reported on 01/21/2020) 40 tablet 0    Lab Results: No results found for this or any previous visit (from the past 48 hour(s)).  Blood Alcohol level:  Lab Results  Component Value Date   ETH <10 01/25/2020   ETH <10 01/21/2020    Physical Findings: AIMS:  , ,  ,  ,    CIWA:    COWS:     Musculoskeletal: Strength & Muscle Tone: within normal limits Gait & Station: normal Patient leans: N/A  Psychiatric Specialty Exam: Physical Exam Vitals and nursing note reviewed.  Constitutional:      Appearance: He is well-developed.  HENT:     Head: Normocephalic.  Cardiovascular:     Rate and Rhythm: Normal rate.  Pulmonary:     Effort: Pulmonary effort is normal.  Neurological:     Mental Status: He is alert and oriented to person, place, and time.  Psychiatric:        Cognition and Memory: Cognition is impaired.        Judgment: Judgment is inappropriate.     Review of Systems  Constitutional: Negative.   HENT: Negative.   Eyes: Negative.   Respiratory: Negative.   Cardiovascular: Negative.    Gastrointestinal: Negative.   Genitourinary: Negative.   Musculoskeletal: Negative.   Skin: Negative.   Neurological: Negative.     Blood pressure (!) 152/94, pulse (!) 101, temperature 97.7 F (36.5 C), temperature source Axillary, resp. rate 18, SpO2 99 %.There is no height or weight on file to calculate BMI.  General Appearance: Casual and Fairly  Groomed  Eye Contact:  Fair  Speech:  Slow  Volume:  Normal  Mood:  Euthymic  Affect:  Congruent  Thought Process:  Goal Directed and Descriptions of Associations: Loose  Orientation:  Other:  oriented to self  Thought Content:  Logical  Suicidal Thoughts:  No  Homicidal Thoughts:  No  Memory:  Immediate;   Poor  Judgement:  Impaired  Insight:  Lacking  Psychomotor Activity:  Normal  Concentration:  Concentration: Poor and Attention Span: Poor  Recall:  Poor  Fund of Knowledge:  Poor  Language:  Poor  Akathisia:  No  Handed:  Right  AIMS (if indicated):     Assets:  Financial Resources/Insurance Housing Intimacy Leisure Time Physical Health Resilience Social Support  ADL's:  Intact  Cognition:  Impaired,  Severe  Sleep:         Treatment Plan Summary: Daily contact with patient to assess and evaluate symptoms and progress in treatment. Inpatient psychiatric treatment recommended. Patient's behavior improving. Patient awaiting inpatient psychiatric placement or placement in appropriate facility.    Emmaline Kluver, [place] 02/08/2020, 10:39 AM  Patient seen face-to-face for psychiatric evaluation, chart reviewed and case discussed with the physician extender and developed treatment plan. Reviewed the information documented and agree with the treatment plan. Corena Pilgrim, MD

## 2020-02-09 ENCOUNTER — Encounter (HOSPITAL_COMMUNITY): Payer: Self-pay | Admitting: Registered Nurse

## 2020-02-09 DIAGNOSIS — R454 Irritability and anger: Secondary | ICD-10-CM | POA: Diagnosis not present

## 2020-02-09 DIAGNOSIS — F840 Autistic disorder: Secondary | ICD-10-CM | POA: Diagnosis not present

## 2020-02-09 DIAGNOSIS — R4689 Other symptoms and signs involving appearance and behavior: Secondary | ICD-10-CM | POA: Diagnosis not present

## 2020-02-09 MED ORDER — ZIPRASIDONE MESYLATE 20 MG IM SOLR
20.0000 mg | Freq: Once | INTRAMUSCULAR | Status: AC
  Administered 2020-02-09: 20 mg via INTRAMUSCULAR
  Filled 2020-02-09: qty 20

## 2020-02-09 MED ORDER — STERILE WATER FOR INJECTION IJ SOLN
INTRAMUSCULAR | Status: AC
  Filled 2020-02-09: qty 10

## 2020-02-09 NOTE — ED Notes (Signed)
Pt cooperative at this time. Comfortable in his room. Watching TV. Has colored. Calling his sitter "Grandma".

## 2020-02-09 NOTE — BH Assessment (Signed)
[facility] Assessment Progress Note  Per Shuvon Rankin, FNP this pt continues to require psychiatric hospitalization, unless a facility can be found that can keep him safe and attend to his needs in the community.  Pt remains under IVC.  At 13:59 I called [name], updating her on pt's disposition.  I informed her that pt had had violent outbursts on the afternoon of 02/08/20, and again in the early morning of today, 02/09/20, resulting in pt being medicated and placed in restraints.  I also noted that later this morning pt was calm and remorseful.  [name] reports that a particular group home is in the process of submitting paperwork to accept pt to their facility.  Adonis Huguenin continues to serve as pt's guardian, pending court date to release him from this duty.  He has taken a tour of the group home, but has not yet give feedback to [facility].  She reports that pt's intake interview with [facility] is scheduled for later this afternoon.  At 14:17 I called [facility].  Per [facility], they do not have any beds available on their IDD unit at this time.  As of this writing, final disposition is pending.  Doylene Canning, [place] [facility] Coordinator [phone]

## 2020-02-09 NOTE — ED Notes (Signed)
Pt continues to be at his baseline - occasionally resting, occasionally singing, attending to his personal hygiene and cleaning his room.  Sometimes wants to color and comes out of his room to get the crayons and coloring books. Sometimes watches TV for a while.

## 2020-02-09 NOTE — ED Notes (Signed)
Gave him HS meds and prn Ativan due to his long hx of agitation and impulsivity when awake. He takes his oral medications without difficulty and vitals taken and then he returned to sleep.

## 2020-02-09 NOTE — ED Provider Notes (Signed)
Emergency Medicine Observation Re-evaluation Note  Jonathan Bird is a 29 y.o. male, seen on rounds today.  Pt initially presented to the ED for complaints of Behavioral episode and Autistic Currently, the patient is medically cleared.  Physical Exam  BP (!) 142/67   Pulse (!) 105   Temp 97.8 F (36.6 C) (Axillary)   Resp 20   SpO2 100%  Physical Exam  ED Course / MDM  EKG:EKG Interpretation  Date/Time:  Monday January 26 2020 12:05:50 EST Ventricular Rate:  110 PR Interval:  166 QRS Duration: 96 QT Interval:  348 QTC Calculation: 470 R Axis:   92 Text Interpretation: Sinus tachycardia Rightward axis Borderline ECG No acute changes No significant change since last tracing Confirmed by Derwood Kaplan [phone]) on 01/26/2020 11:29:47 PM    I have reviewed the labs performed to date as well as medications administered while in observation.  Recent changes in the last 24 hours include periods of agitation requiring sedation. Plan  Current plan is for inpatient bed search.    Terrilee Files, MD 02/09/20 [phone]

## 2020-02-09 NOTE — ED Notes (Signed)
Patient wake up this morning very agitated and violent. EDP informed. Geodon 30m IM Given, Violent restraint initiated. Will continue to monitor.

## 2020-02-09 NOTE — ED Notes (Signed)
Pt woke up and was calm and wanted a shower. Pt wanted to shave his mustache so sitter used Neurosurgeon to shave pt. He is currently sitting in his room coloring.

## 2020-02-09 NOTE — ED Notes (Signed)
Environmental services cleaned pt's room and BR.

## 2020-02-09 NOTE — ED Notes (Signed)
After receiving morning report this writer checked on pt and found him to be asleep.  Restraints removed per protocol because he was asleep. Breathing is visible, even and unlabored.

## 2020-02-09 NOTE — Consult Note (Addendum)
[facility] Psych ED Progress Note  02/09/2020 10:21 AM Jonathan Bird  MRN:  [id number]   Subjective:  "I good.  Eat pancake breakfast.  Like pink car, red car, yellow car.  Jonathan Bird, 29 y.o., male patient seen psychiatric follow up via tele psych by this provider, Dr. Lucianne Muss; and chart reviewed on 02/09/20.  On evaluation Jonathan Bird seen standing in door way talking to nurse.  Patient appears to be in a good mood this morning.  When asked what happened to get him upset patient responded "I hit, I apologize, I wrong."   During evaluation Jonathan Bird is alert, oriented to person and place, calm/cooperative; and mood is congruent with affect.  He does not appear to be responding to internal/external stimuli or delusional thoughts.  Denies that he wants to hurt himself or others.       Principal Problem: Aggression Diagnosis:  Principal Problem:   Aggression Active Problems:   Autism disorder   Fetal alcohol syndrome   Intellectual disability  Total Time spent with patient: 20 minutes  Past Psychiatric History: Autism disorder, fetal alcohol syndrome, intellectual disability  Past Medical History:  Past Medical History:  Diagnosis Date  . Autism    outbursts  . Autism spectrum   . Bipolar disorder (HCC)   . Diabetes mellitus without complication (HCC)    boarderline  . Hypertension     Past Surgical History:  Procedure Laterality Date  . NO PAST SURGERIES    . TOOTH EXTRACTION N/A 05/25/2014   Procedure: EXTRACTION WISDOM TEETH, Extraction teeth # 1, 16, 17, 32.;  Surgeon: Georgia Lopes, DDS;  Location: [facility] OR;  Service: Oral Surgery;  Laterality: N/A;   Family History: History reviewed. No pertinent family history. Family Psychiatric  History: Unknown Social History:  Social History   Substance and Sexual Activity  Alcohol Use No     Social History   Substance and Sexual Activity  Drug Use No    Social History   Socioeconomic History  . Marital status: Single     Spouse name: Not on file  . Number of children: Not on file  . Years of education: Not on file  . Highest education level: Not on file  Occupational History  . Not on file  Tobacco Use  . Smoking status: Never Smoker  . Smokeless tobacco: Never Used  Substance and Sexual Activity  . Alcohol use: No  . Drug use: No  . Sexual activity: Never  Other Topics Concern  . Not on file  Social History Narrative  . Not on file   Social Determinants of Health   Financial Resource Strain:   . Difficulty of Paying Living Expenses: Not on file  Food Insecurity:   . Worried About Programme researcher, broadcasting/film/video in the Last Year: Not on file  . Ran Out of Food in the Last Year: Not on file  Transportation Needs:   . Lack of Transportation (Medical): Not on file  . Lack of Transportation (Non-Medical): Not on file  Physical Activity:   . Days of Exercise per Week: Not on file  . Minutes of Exercise per Session: Not on file  Stress:   . Feeling of Stress : Not on file  Social Connections:   . Frequency of Communication with Friends and Family: Not on file  . Frequency of Social Gatherings with Friends and Family: Not on file  . Attends Religious Services: Not on file  . Active Member  of Clubs or Organizations: Not on file  . Attends Archivist Meetings: Not on file  . Marital Status: Not on file    Sleep: Good  Appetite:  Good  Current Medications: Current Facility-Administered Medications  Medication Dose Route Frequency Provider Last Rate Last Admin  . amantadine (SYMMETREL) capsule 100 mg  100 mg Oral BID Domenic Moras, PA-C   100 mg at 02/09/20 9622  . carbamazepine (TEGRETOL) chewable tablet 200 mg  200 mg Oral BID Jetaun Colbath B, NP   200 mg at 02/09/20 0926  . chlorproMAZINE (THORAZINE) injection 25 mg  25 mg Intramuscular TID PRN Dragan Tamburrino B, NP   25 mg at 02/09/20 0515  . chlorproMAZINE (THORAZINE) tablet 100 mg  100 mg Oral TID Domenic Moras, PA-C   100 mg at 02/09/20  2979  . lidocaine-EPINEPHrine-tetracaine (LET) topical gel  3 mL Topical Once Domenic Moras, PA-C      . loratadine (CLARITIN) tablet 10 mg  10 mg Oral Daily Domenic Moras, PA-C   10 mg at 02/09/20 8921  . LORazepam (ATIVAN) tablet 2.5 mg  2.5 mg Oral Q4H PRN Domenic Moras, PA-C   2.5 mg at 02/09/20 0926  . mirtazapine (REMERON) tablet 45 mg  45 mg Oral QHS Domenic Moras, PA-C   45 mg at 02/08/20 2132   Current Outpatient Medications  Medication Sig Dispense Refill  . amantadine (SYMMETREL) 100 MG capsule Take 100 mg by mouth 2 (two) times daily.    . carbamazepine (TEGRETOL) 200 MG tablet Take 200 mg by mouth 2 (two) times daily.    . cetirizine (ZYRTEC) 10 MG tablet Take 10 mg by mouth daily.    . chlorproMAZINE (THORAZINE) 100 MG tablet Take 100 mg by mouth 3 (three) times daily.    . hydrOXYzine (ATARAX/VISTARIL) 25 MG tablet Take 25 mg by mouth 3 (three) times daily.     Marland Kitchen LORazepam (ATIVAN) 2 MG tablet Take 2.5 mg by mouth 2 (two) times daily.     . mirtazapine (REMERON) 30 MG tablet Take 45 mg by mouth at bedtime. Mat give second dose if awake in night    . fluticasone (FLONASE) 50 MCG/ACT nasal spray Place 2 sprays into the nose daily. 2 sprays to each nostril at bedtime. (Patient not taking: Reported on 01/25/2020) 16 g 2  . oxyCODONE-acetaminophen (PERCOCET) 5-325 MG per tablet Take 2 tablets by mouth every 4 (four) hours as needed for severe pain. (Patient not taking: Reported on 01/21/2020) 40 tablet 0    Lab Results: No results found for this or any previous visit (from the past 48 hour(s)).  Blood Alcohol level:  Lab Results  Component Value Date   ETH <10 01/25/2020   ETH <10 01/21/2020    Physical Findings: AIMS:  , ,  ,  ,    CIWA:    COWS:     Musculoskeletal: Strength & Muscle Tone: within normal limits Gait & Station: normal Patient leans: N/A  Psychiatric Specialty Exam: Physical Exam Vitals and nursing note reviewed. Exam conducted with a chaperone present (Sitter  at bed side).  Constitutional:      Appearance: He is well-developed.  HENT:     Head: Normocephalic.  Cardiovascular:     Rate and Rhythm: Normal rate.  Pulmonary:     Effort: Pulmonary effort is normal.  Neurological:     Mental Status: He is alert and oriented to person, place, and time.  Psychiatric:  Cognition and Memory: Cognition is impaired.        Judgment: Judgment is inappropriate.     Review of Systems  Constitutional: Negative.   HENT: Negative.   Eyes: Negative.   Respiratory: Negative.   Cardiovascular: Negative.   Gastrointestinal: Negative.   Genitourinary: Negative.   Musculoskeletal: Negative.   Skin: Negative.   Neurological: Negative.   Psychiatric/Behavioral: Negative for hallucinations, self-injury and suicidal ideas.    Blood pressure 140/88, pulse (!) 111, temperature 97.8 F (36.6 C), temperature source Axillary, resp. rate 20, SpO2 100 %.There is no height or weight on file to calculate BMI.  General Appearance: Casual and Fairly Groomed  Eye Contact:  Fair  Speech:  Slow  Volume:  Normal  Mood:  Euthymic  Affect:  Congruent  Thought Process:  Goal Directed and Descriptions of Associations: Loose  Orientation:  Other:  oriented to self  Thought Content:  Logical  Suicidal Thoughts:  No  Homicidal Thoughts:  No  Memory:  Immediate;   Poor  Judgement:  Impaired  Insight:  Lacking  Psychomotor Activity:  Normal  Concentration:  Concentration: Poor and Attention Span: Poor  Recall:  Poor  Fund of Knowledge:  Poor  Language:  Poor  Akathisia:  No  Handed:  Right  AIMS (if indicated):     Assets:  Financial Resources/Insurance Housing Intimacy Leisure Time Physical Health Resilience Social Support  ADL's:  Intact  Cognition:  Impaired,  Severe  Sleep:         Treatment Plan Summary: Daily contact with patient to assess and evaluate symptoms and progress in treatment. Inpatient psychiatric treatment recommended. Patient's  behavior improving. Patient awaiting inpatient psychiatric placement or placement in appropriate facility.   Shyquan Stallbaumer, NP 02/09/2020, 10:21 AM

## 2020-02-10 DIAGNOSIS — F840 Autistic disorder: Secondary | ICD-10-CM | POA: Diagnosis not present

## 2020-02-10 DIAGNOSIS — R454 Irritability and anger: Secondary | ICD-10-CM | POA: Diagnosis not present

## 2020-02-10 NOTE — ED Notes (Signed)
Patient requested hair under his arms to be removed. Patients underarms shaved. Patient now resting in bed.

## 2020-02-10 NOTE — ED Notes (Signed)
Woke up and immediately loud and has abrupt and brisk movements. He washed his hands several times then tore up the paper towels into the trash can. It is not quite 0400. Gave him prn Ativan and ginger ale. He was offered food but declined it. Encouraged to return to sleep. Turned his lights off but he got up and turned them all back on, drank the remainder of the ginger ale while looking at himself in the mirror,used the bathroom, then returned to bed.

## 2020-02-10 NOTE — ED Notes (Signed)
Patient continues to be impulsive. Charging out of room and saying "good job". Patient ate breakfast tray and took PRN medications. Patient requested a hot shower and is showering at this time.

## 2020-02-10 NOTE — ED Notes (Signed)
Patient resting in bed with eyes closed at this time. No aggressive/combative behavior this shift. Patient took medications when given. IVC renewed today. Per Janice Coffin, counselor, patient may have placement on 3/65/21. This information not communicated to patient or family at this time.

## 2020-02-10 NOTE — ED Notes (Signed)
Patient requesting to call his aunt susan. Phone number is [phone]. Gave patient phone to call susan.

## 2020-02-10 NOTE — ED Notes (Signed)
Security called back once about 20 min ago and he followed the direction of the officer and laid down. Once security left the unit because he was calm, he got up and his behavior escalated. He started yelling and shoving side table into the wall several times. Security called again to help manage his behavior and Clinical research associate gave him the IM prn of Thorazine that is available. Put music on for him, but he is not calming down. Intermittently explosively loud and rushes toward staff. He also rushed out into the hall and slid on his butt, he did not hurt self, behavior issue. He got up on his own and returned to his room. He is currently listening to music but continues to be impulsive and loud when peers are all trying to sleep.Security remains present.

## 2020-02-10 NOTE — ED Notes (Signed)
Winona Legato, patient's grandmother, called to speak with patient. Patient is sleeping at this time. Ms. Tyler Aas requesting call back from patient. # is [phone]

## 2020-02-10 NOTE — ED Notes (Signed)
Has not stayed in bed, continuously washing and drying his hands.

## 2020-02-10 NOTE — Consult Note (Signed)
[facility] Psych ED Progress Note  02/10/2020 9:21 AM Jonathan Bird  MRN:  [id number]   Subjective:  "I ate muffin."  Jonathan Bird, 29 y.o., male patient seen psychiatric follow up via tele psych by this provider, Dr. Lucianne Muss; and chart reviewed on 02/10/20.  On evaluation Jonathan Bird sitting at food of bed; currently in good mood appears to be tired but when asked if he wanted to take a nap he responded "no."  Pointed out pictures he had colored taped on wall.  Asked what he like to do stated "pink car, red car, white car."  Chart reviewed patient up around 4 am, continuous washing of hands, behavioral outburst and having to be medicated; again around 6 am.  Appears to have calmed down by the time this assessment done.   During evaluation Jonathan Bird is alert, oriented to person and place.  Currently he is calm/cooperative.  He does not appear to be responding to internal/external stimuli or delusional thoughts.  Denies that he wants to hurt himself or others.       Principal Problem: Aggression Diagnosis:  Principal Problem:   Aggression Active Problems:   Autism disorder   Fetal alcohol syndrome   Intellectual disability  Total Time spent with patient: 15 minutes  Past Psychiatric History: Autism disorder, fetal alcohol syndrome, intellectual disability  Past Medical History:  Past Medical History:  Diagnosis Date  . Autism    outbursts  . Autism spectrum   . Bipolar disorder (HCC)   . Diabetes mellitus without complication (HCC)    boarderline  . Hypertension     Past Surgical History:  Procedure Laterality Date  . NO PAST SURGERIES    . TOOTH EXTRACTION N/A 05/25/2014   Procedure: EXTRACTION WISDOM TEETH, Extraction teeth # 1, 16, 17, 32.;  Surgeon: Georgia Lopes, DDS;  Location: [facility] OR;  Service: Oral Surgery;  Laterality: N/A;   Family History: History reviewed. No pertinent family history. Family Psychiatric  History: Unknown Social History:  Social History    Substance and Sexual Activity  Alcohol Use No     Social History   Substance and Sexual Activity  Drug Use No    Social History   Socioeconomic History  . Marital status: Single    Spouse name: Not on file  . Number of children: Not on file  . Years of education: Not on file  . Highest education level: Not on file  Occupational History  . Not on file  Tobacco Use  . Smoking status: Never Smoker  . Smokeless tobacco: Never Used  Substance and Sexual Activity  . Alcohol use: No  . Drug use: No  . Sexual activity: Never  Other Topics Concern  . Not on file  Social History Narrative  . Not on file   Social Determinants of Health   Financial Resource Strain:   . Difficulty of Paying Living Expenses: Not on file  Food Insecurity:   . Worried About Programme researcher, broadcasting/film/video in the Last Year: Not on file  . Ran Out of Food in the Last Year: Not on file  Transportation Needs:   . Lack of Transportation (Medical): Not on file  . Lack of Transportation (Non-Medical): Not on file  Physical Activity:   . Days of Exercise per Week: Not on file  . Minutes of Exercise per Session: Not on file  Stress:   . Feeling of Stress : Not on file  Social Connections:   .  Frequency of Communication with Friends and Family: Not on file  . Frequency of Social Gatherings with Friends and Family: Not on file  . Attends Religious Services: Not on file  . Active Member of Clubs or Organizations: Not on file  . Attends Archivist Meetings: Not on file  . Marital Status: Not on file    Sleep: Good  Appetite:  Good  Current Medications: Current Facility-Administered Medications  Medication Dose Route Frequency Provider Last Rate Last Admin  . amantadine (SYMMETREL) capsule 100 mg  100 mg Oral BID Domenic Moras, PA-C   100 mg at 02/09/20 2143  . carbamazepine (TEGRETOL) chewable tablet 200 mg  200 mg Oral BID Hye Trawick B, NP   200 mg at 02/09/20 2142  . chlorproMAZINE (THORAZINE)  injection 25 mg  25 mg Intramuscular TID PRN Louvenia Golomb B, NP   25 mg at 02/10/20 8341  . chlorproMAZINE (THORAZINE) tablet 100 mg  100 mg Oral TID Domenic Moras, PA-C   100 mg at 02/09/20 2142  . lidocaine-EPINEPHrine-tetracaine (LET) topical gel  3 mL Topical Once Domenic Moras, PA-C      . loratadine (CLARITIN) tablet 10 mg  10 mg Oral Daily Domenic Moras, PA-C   10 mg at 02/09/20 9622  . LORazepam (ATIVAN) tablet 2.5 mg  2.5 mg Oral Q4H PRN Domenic Moras, PA-C   2.5 mg at 02/10/20 0817  . mirtazapine (REMERON) tablet 45 mg  45 mg Oral QHS Domenic Moras, PA-C   45 mg at 02/09/20 2142   Current Outpatient Medications  Medication Sig Dispense Refill  . amantadine (SYMMETREL) 100 MG capsule Take 100 mg by mouth 2 (two) times daily.    . carbamazepine (TEGRETOL) 200 MG tablet Take 200 mg by mouth 2 (two) times daily.    . cetirizine (ZYRTEC) 10 MG tablet Take 10 mg by mouth daily.    . chlorproMAZINE (THORAZINE) 100 MG tablet Take 100 mg by mouth 3 (three) times daily.    . hydrOXYzine (ATARAX/VISTARIL) 25 MG tablet Take 25 mg by mouth 3 (three) times daily.     Marland Kitchen LORazepam (ATIVAN) 2 MG tablet Take 2.5 mg by mouth 2 (two) times daily.     . mirtazapine (REMERON) 30 MG tablet Take 45 mg by mouth at bedtime. Mat give second dose if awake in night    . fluticasone (FLONASE) 50 MCG/ACT nasal spray Place 2 sprays into the nose daily. 2 sprays to each nostril at bedtime. (Patient not taking: Reported on 01/25/2020) 16 g 2  . oxyCODONE-acetaminophen (PERCOCET) 5-325 MG per tablet Take 2 tablets by mouth every 4 (four) hours as needed for severe pain. (Patient not taking: Reported on 01/21/2020) 40 tablet 0    Lab Results: No results found for this or any previous visit (from the past 48 hour(s)).  Blood Alcohol level:  Lab Results  Component Value Date   ETH <10 01/25/2020   ETH <10 01/21/2020    Physical Findings: AIMS:  , ,  ,  ,    CIWA:    COWS:     Musculoskeletal: Strength & Muscle Tone:  within normal limits Gait & Station: normal Patient leans: N/A  Psychiatric Specialty Exam: Physical Exam Vitals and nursing note reviewed. Exam conducted with a chaperone present (Sitter at bed side).  Constitutional:      Appearance: He is well-developed.  HENT:     Head: Normocephalic.  Cardiovascular:     Rate and Rhythm: Normal rate.  Pulmonary:  Effort: Pulmonary effort is normal.  Neurological:     Mental Status: He is alert and oriented to person, place, and time.  Psychiatric:        Cognition and Memory: Cognition is impaired.        Judgment: Judgment is inappropriate.     Review of Systems  Constitutional: Negative.   HENT: Negative.   Eyes: Negative.   Respiratory: Negative.   Cardiovascular: Negative.   Gastrointestinal: Negative.   Genitourinary: Negative.   Musculoskeletal: Negative.   Skin: Negative.   Neurological: Negative.   Psychiatric/Behavioral: Negative for hallucinations, self-injury and suicidal ideas.    Blood pressure (!) 138/106, pulse 96, temperature 98 F (36.7 C), temperature source Oral, resp. rate 18, SpO2 100 %.There is no height or weight on file to calculate BMI.  General Appearance: Casual and Fairly Groomed  Eye Contact:  Fair  Speech:  Slow  Volume:  Normal  Mood:  Euthymic  Affect:  Congruent  Thought Process:  Goal Directed and Descriptions of Associations: Loose  Orientation:  Other:  oriented to self and place  Thought Content:  Logical  Suicidal Thoughts:  No  Homicidal Thoughts:  No  Memory:  Immediate;   Poor  Judgement:  Impaired  Insight:  Lacking  Psychomotor Activity:  Normal  Concentration:  Concentration: Poor and Attention Span: Poor  Recall:  Poor  Fund of Knowledge:  Poor  Language:  Poor  Akathisia:  No  Handed:  Right  AIMS (if indicated):     Assets:  Financial Resources/Insurance Housing Intimacy Leisure Time Physical Health Resilience Social Support  ADL's:  Intact  Cognition:  Impaired,   Severe  Sleep:         Treatment Plan Summary: Daily contact with patient to assess and evaluate symptoms and progress in treatment. Inpatient psychiatric treatment or placement that is safe for patient.  Patients behavior has improved but continues to have episodic behavioral outburst which may be a result of being restricted to ED with nothing to do.  Patient awaiting inpatient psychiatric or appropriate facility placement that is safe for patient.     Michalina Calbert, NP 02/10/2020, 9:21 AM

## 2020-02-10 NOTE — ED Notes (Signed)
Laid down for approx five min and then up, all lights on, washing hands again. Tech offered him pages to color and he sat down with them and is coloring now.

## 2020-02-10 NOTE — BH Assessment (Addendum)
[facility] Assessment Progress Note  Per Shuvon Rankin, FNP this pt continues to require psychiatric hospitalization, unless a facility can be found that can keep him safe and attend to his needs in the community.  Pt's IVC expires today, but Nelly Rout, MD finds that pt continues to meet criteria for IVC, which she has initiated.  IVC documents have been faxed to [facility], and at [employer] confirms receipt.  He has since faxed Findings and Custody Order to this Clinical research associate.  At 11:28 I called [employer].  They took demographic information, agreeing to dispatch law enforcement to fill out Return of Service.  As of this writing, arrival of law enforcement is pending.  At 11:56 I called [name], updating her on pt's behavior and disposition.  After reviewing nursing notes from the last 24 hours, I informed her that pt became agitated overnight at one point, requiring medication. I noted that pt was menacing to staff, but that there is no record of pt becoming combative, or engaging in self-injurious behavior or property destruction.  I noted that pt was not placed in restraints.  Per the request of Dr Lucianne Muss, I asked if there were any known ways of de-escalating pt.  She reports that pt likes music and movies, and that he likes to have pencils around, which he uses as drumsticks.  He also likes to have a musical keyboard, although he broke his last one and so does not have a functional one at this time.  He also has stuffed toy bears that he likes.  [name] reports that she will be speaking to Adonis Huguenin later today about possible group home placement for pt.  She will ask him about any other safe means by which pt can be de-escalated, and will ask him to bring appropriate items to the ED.  [name] adds that process for getting pt admitted to a prospective group home continues, as is the process for [place] [employer] benefits.  She will keep [place] updated.  At 12:11 I called [facility].   They report that at this time they do not have any beds available on their IDD unit.  Final disposition is pending as of this writing.  Doylene Canning, [place] Behavioral Health Coordinator [phone]   Addendum:  At 14:41 [name] calls to report that a group home has tentatively agreed to accept pt, but will not have staffing available before 02/14/2020.  [name] will continue to explore alternatives in the meantime.  She adds that she has spoken to [place].  He agrees to clean pt's teddy bears and to bring them to [facility] along with any other appropriate comfort items that may help to manage pt.  Pt's nurse, Melody, has been notified.  Doylene Canning, [place] [facility] Coordinator [phone]

## 2020-02-11 DIAGNOSIS — F840 Autistic disorder: Secondary | ICD-10-CM | POA: Diagnosis not present

## 2020-02-11 DIAGNOSIS — R454 Irritability and anger: Secondary | ICD-10-CM | POA: Diagnosis not present

## 2020-02-11 NOTE — ED Notes (Signed)
Patient became agitated more than his usual today. He started hollering loudly and running out the room. One time, he started to grab a computer screen in the hallway, and put his right hand in his mouth and acted like he was going to bite himself. Administered  ATIVAN PRN for this increase in agitation. Provided him ice cream. Currently, patient is sitting on the bed, intermittently yelling out, and eating his ice cream.

## 2020-02-11 NOTE — ED Notes (Signed)
Redirected patient and he is sitting down on his bed and attempting to color. He continues to intermittently yell out.

## 2020-02-11 NOTE — BH Specialist Note (Addendum)
Per Assunta Found, NP and Dr. Lucianne Muss patient continues to meet in patient criteria. Per Assunta Found, NP, patient will continue to remain in the ED until appropriate placement is sought. Followed up with patient's [facility], [name] [phone]).  [name] states that she spoke to the group home expecting to assume responsibility for patient. States that the group home is scheduled to take patient Monday, 02-16-2020. The group will need the rest of the week to train staff prior to Mr. Fisch coming into their facility. Also [facility] will consider accepting patient to one of their crises beds. However, this decision is pending a meeting expected to be held today.

## 2020-02-11 NOTE — Consult Note (Signed)
[facility] Psych ED Progress Note  02/11/2020 3:17 PM Jonathan Bird  MRN:  [id number]   Subjective:  "I ate muffin."  Jonathan Bird, 29 y.o., male patient seen psychiatric follow up via tele psych by this provider, Dr. Dwyane Dee; and chart reviewed on 02/11/20.  On evaluation Jonathan Bird patient had covers pulled over his head until heard voice asking how he was doing.  "Doing good, Had french toast, Raining outside."  [place] his birthday is "[birth year]" and he is "29 yr old"  Patient asked about pink car, white car, white shirt, blue underwear, and pencils.  During evaluation Jonathan Bird is alert, oriented to person and place.  Currently he is calm/cooperative.  He does not appear to be responding to internal/external stimuli or delusional thoughts.  Denies that he wants to hurt himself or others.  Continues to have episodes of behavioral outburst.        Principal Problem: Aggression Diagnosis:  Principal Problem:   Aggression Active Problems:   Autism disorder   Fetal alcohol syndrome   Intellectual disability  Total Time spent with patient: 15 minutes  Past Psychiatric History: Autism disorder, fetal alcohol syndrome, intellectual disability  Past Medical History:  Past Medical History:  Diagnosis Date  . Autism    outbursts  . Autism spectrum   . Bipolar disorder ([place])   . Diabetes mellitus without complication ([place])    boarderline  . Hypertension     Past Surgical History:  Procedure Laterality Date  . NO PAST SURGERIES    . TOOTH EXTRACTION N/A 05/25/2014   Procedure: EXTRACTION WISDOM TEETH, Extraction teeth # 1, 16, 17, 32.;  Surgeon: Gae Bon, DDS;  Location: [facility];  Service: Oral Surgery;  Laterality: N/A;   Family History: History reviewed. No pertinent family history. Family Psychiatric  History: Unknown Social History:  Social History   Substance and Sexual Activity  Alcohol Use No     Social History   Substance and Sexual Activity  Drug Use No     Social History   Socioeconomic History  . Marital status: Single    Spouse name: Not on file  . Number of children: Not on file  . Years of education: Not on file  . Highest education level: Not on file  Occupational History  . Not on file  Tobacco Use  . Smoking status: Never Smoker  . Smokeless tobacco: Never Used  Substance and Sexual Activity  . Alcohol use: No  . Drug use: No  . Sexual activity: Never  Other Topics Concern  . Not on file  Social History Narrative  . Not on file   Social Determinants of Health   Financial Resource Strain:   . Difficulty of Paying Living Expenses: Not on file  Food Insecurity:   . Worried About Charity fundraiser in the Last Year: Not on file  . Ran Out of Food in the Last Year: Not on file  Transportation Needs:   . Lack of Transportation (Medical): Not on file  . Lack of Transportation (Non-Medical): Not on file  Physical Activity:   . Days of Exercise per Week: Not on file  . Minutes of Exercise per Session: Not on file  Stress:   . Feeling of Stress : Not on file  Social Connections:   . Frequency of Communication with Friends and Family: Not on file  . Frequency of Social Gatherings with Friends and Family: Not on file  . Attends  Religious Services: Not on file  . Active Member of Clubs or Organizations: Not on file  . Attends Banker Meetings: Not on file  . Marital Status: Not on file    Sleep: Good  Appetite:  Good  Current Medications: Current Facility-Administered Medications  Medication Dose Route Frequency Provider Last Rate Last Admin  . amantadine (SYMMETREL) capsule 100 mg  100 mg Oral BID Fayrene Helper, PA-C   100 mg at 02/11/20 6767  . carbamazepine (TEGRETOL) chewable tablet 200 mg  200 mg Oral BID Tadarrius Burch B, NP   200 mg at 02/11/20 0912  . chlorproMAZINE (THORAZINE) injection 25 mg  25 mg Intramuscular TID PRN Dymond Spreen B, NP   25 mg at 02/10/20 2094  . chlorproMAZINE (THORAZINE)  tablet 100 mg  100 mg Oral TID Fayrene Helper, PA-C   100 mg at 02/11/20 1507  . lidocaine-EPINEPHrine-tetracaine (LET) topical gel  3 mL Topical Once Fayrene Helper, PA-C      . loratadine (CLARITIN) tablet 10 mg  10 mg Oral Daily Fayrene Helper, PA-C   10 mg at 02/11/20 0911  . LORazepam (ATIVAN) tablet 2.5 mg  2.5 mg Oral Q4H PRN Fayrene Helper, PA-C   2.5 mg at 02/11/20 1359  . mirtazapine (REMERON) tablet 45 mg  45 mg Oral QHS Fayrene Helper, PA-C   45 mg at 02/11/20 0117   Current Outpatient Medications  Medication Sig Dispense Refill  . amantadine (SYMMETREL) 100 MG capsule Take 100 mg by mouth 2 (two) times daily.    . carbamazepine (TEGRETOL) 200 MG tablet Take 200 mg by mouth 2 (two) times daily.    . cetirizine (ZYRTEC) 10 MG tablet Take 10 mg by mouth daily.    . chlorproMAZINE (THORAZINE) 100 MG tablet Take 100 mg by mouth 3 (three) times daily.    . hydrOXYzine (ATARAX/VISTARIL) 25 MG tablet Take 25 mg by mouth 3 (three) times daily.     Marland Kitchen LORazepam (ATIVAN) 2 MG tablet Take 2.5 mg by mouth 2 (two) times daily.     . mirtazapine (REMERON) 30 MG tablet Take 45 mg by mouth at bedtime. Mat give second dose if awake in night    . fluticasone (FLONASE) 50 MCG/ACT nasal spray Place 2 sprays into the nose daily. 2 sprays to each nostril at bedtime. (Patient not taking: Reported on 01/25/2020) 16 g 2  . oxyCODONE-acetaminophen (PERCOCET) 5-325 MG per tablet Take 2 tablets by mouth every 4 (four) hours as needed for severe pain. (Patient not taking: Reported on 01/21/2020) 40 tablet 0    Lab Results: No results found for this or any previous visit (from the past 48 hour(s)).  Blood Alcohol level:  Lab Results  Component Value Date   ETH <10 01/25/2020   ETH <10 01/21/2020    Physical Findings: AIMS:  , ,  ,  ,    CIWA:    COWS:     Musculoskeletal: Strength & Muscle Tone: within normal limits Gait & Station: normal Patient leans: N/A  Psychiatric Specialty Exam: Physical Exam Vitals and  nursing note reviewed. Exam conducted with a chaperone present (Sitter at bed side).  Constitutional:      Appearance: He is well-developed.  HENT:     Head: Normocephalic.  Cardiovascular:     Rate and Rhythm: Normal rate.  Pulmonary:     Effort: Pulmonary effort is normal.  Neurological:     Mental Status: He is alert and oriented to person, place, and time.  Psychiatric:        Cognition and Memory: Cognition is impaired.        Judgment: Judgment is inappropriate.     Review of Systems  Constitutional: Negative.   HENT: Negative.   Eyes: Negative.   Respiratory: Negative.   Cardiovascular: Negative.   Gastrointestinal: Negative.   Genitourinary: Negative.   Musculoskeletal: Negative.   Skin: Negative.   Neurological: Negative.   Psychiatric/Behavioral: Negative for hallucinations, self-injury and suicidal ideas.    Blood pressure 140/88, pulse 97, temperature 97.7 F (36.5 C), temperature source Oral, resp. rate 18, SpO2 99 %.There is no height or weight on file to calculate BMI.  General Appearance: Casual and Fairly Groomed  Eye Contact:  Fair  Speech:  Slow  Volume:  Normal  Mood:  Euthymic  Affect:  Congruent  Thought Process:  Goal Directed and Descriptions of Associations: Loose  Orientation:  Other:  oriented to self and place  Thought Content:  Logical  Suicidal Thoughts:  No  Homicidal Thoughts:  No  Memory:  Immediate;   Poor  Judgement:  Impaired  Insight:  Lacking  Psychomotor Activity:  Normal  Concentration:  Concentration: Poor and Attention Span: Poor  Recall:  Poor  Fund of Knowledge:  Poor  Language:  Poor  Akathisia:  No  Handed:  Right  AIMS (if indicated):     Assets:  Financial Resources/Insurance Housing Intimacy Leisure Time Physical Health Resilience Social Support  ADL's:  Intact  Cognition:  Impaired,  Severe  Sleep:      Treatment Plan Summary: Daily contact with patient to assess and evaluate symptoms and progress in  treatment. Inpatient psychiatric treatment or placement that is safe for patient.  Patients behavior has improved but continues to have episodic behavioral outburst which may be a result of being restricted to ED with nothing to do.  Patient awaiting inpatient psychiatric or appropriate facility placement that is safe for patient.     No change in treatment plan at this time  Assunta Found, NP 02/11/2020, 3:17 PM

## 2020-02-12 DIAGNOSIS — R4689 Other symptoms and signs involving appearance and behavior: Secondary | ICD-10-CM | POA: Diagnosis not present

## 2020-02-12 DIAGNOSIS — F840 Autistic disorder: Secondary | ICD-10-CM | POA: Diagnosis not present

## 2020-02-12 MED ORDER — LORAZEPAM 1 MG PO TABS
1.0000 mg | ORAL_TABLET | Freq: Once | ORAL | Status: AC
  Administered 2020-02-12: 1 mg via ORAL
  Filled 2020-02-12: qty 1

## 2020-02-12 NOTE — Progress Notes (Signed)
Patient escalated with at shift change. MD came back here and witnessed his escalation. Security called. Per MD, give PRN ativan and night-time medication early. Continue to monitor.

## 2020-02-12 NOTE — Consult Note (Signed)
[facility] Psych ED Progress Note  02/12/2020 11:39 AM Jonathan Bird  MRN:  [id number]   Subjective:  "I am okay."  Jonathan Bird, 29 y.o., male patient seen psychiatric follow up via tele psych by this provider,chart reviewed and discussed with Dr. Dwyane Dee on 02/12/20.  On evaluation Jonathan Bird was standing immediately outside his hospital room, dancing and singing loudly.  He spontaneously greets this Probation officer and continues to dance and sing.  When asked what he had for breakfast he states, "I had pancakes and oatmeal."   During evaluation Jonathan Bird is alert, oriented to person and place.  Currently he is calm/cooperative, is smiling and laughing during assessment.  He does not appear to be responding to internal/external stimuli or delusional thoughts.  Denies that he wants to hurt himself or others.  Continues to have episodes of behavioral outburst.        Principal Problem: Aggression Diagnosis:  Principal Problem:   Aggression Active Problems:   Autism disorder   Fetal alcohol syndrome   Intellectual disability  Total Time spent with patient: 15 minutes  Past Psychiatric History: Autism disorder, fetal alcohol syndrome, intellectual disability  Past Medical History:  Past Medical History:  Diagnosis Date  . Autism    outbursts  . Autism spectrum   . Bipolar disorder ([place])   . Diabetes mellitus without complication ([place])    boarderline  . Hypertension     Past Surgical History:  Procedure Laterality Date  . NO PAST SURGERIES    . TOOTH EXTRACTION N/A 05/25/2014   Procedure: EXTRACTION WISDOM TEETH, Extraction teeth # 1, 16, 17, 32.;  Surgeon: Gae Bon, DDS;  Location: [facility];  Service: Oral Surgery;  Laterality: N/A;   Family History: History reviewed. No pertinent family history. Family Psychiatric  History: Unknown Social History:  Social History   Substance and Sexual Activity  Alcohol Use No     Social History   Substance and Sexual Activity  Drug Use No     Social History   Socioeconomic History  . Marital status: Single    Spouse name: Not on file  . Number of children: Not on file  . Years of education: Not on file  . Highest education level: Not on file  Occupational History  . Not on file  Tobacco Use  . Smoking status: Never Smoker  . Smokeless tobacco: Never Used  Substance and Sexual Activity  . Alcohol use: No  . Drug use: No  . Sexual activity: Never  Other Topics Concern  . Not on file  Social History Narrative  . Not on file   Social Determinants of Health   Financial Resource Strain:   . Difficulty of Paying Living Expenses: Not on file  Food Insecurity:   . Worried About Charity fundraiser in the Last Year: Not on file  . Ran Out of Food in the Last Year: Not on file  Transportation Needs:   . Lack of Transportation (Medical): Not on file  . Lack of Transportation (Non-Medical): Not on file  Physical Activity:   . Days of Exercise per Week: Not on file  . Minutes of Exercise per Session: Not on file  Stress:   . Feeling of Stress : Not on file  Social Connections:   . Frequency of Communication with Friends and Family: Not on file  . Frequency of Social Gatherings with Friends and Family: Not on file  . Attends Religious Services: Not on  file  . Active Member of Clubs or Organizations: Not on file  . Attends Banker Meetings: Not on file  . Marital Status: Not on file    Sleep: Good  Appetite:  Good  Current Medications: Current Facility-Administered Medications  Medication Dose Route Frequency Provider Last Rate Last Admin  . amantadine (SYMMETREL) capsule 100 mg  100 mg Oral BID Fayrene Helper, PA-C   100 mg at 02/12/20 2671  . carbamazepine (TEGRETOL) chewable tablet 200 mg  200 mg Oral BID Rankin, Shuvon B, NP   200 mg at 02/12/20 2458  . chlorproMAZINE (THORAZINE) injection 25 mg  25 mg Intramuscular TID PRN Rankin, Shuvon B, NP   25 mg at 02/10/20 0998  . chlorproMAZINE  (THORAZINE) tablet 100 mg  100 mg Oral TID Fayrene Helper, PA-C   100 mg at 02/12/20 3382  . lidocaine-EPINEPHrine-tetracaine (LET) topical gel  3 mL Topical Once Fayrene Helper, PA-C      . loratadine (CLARITIN) tablet 10 mg  10 mg Oral Daily Fayrene Helper, PA-C   10 mg at 02/12/20 5053  . LORazepam (ATIVAN) tablet 2.5 mg  2.5 mg Oral Q4H PRN Fayrene Helper, PA-C   2.5 mg at 02/12/20 0745  . mirtazapine (REMERON) tablet 45 mg  45 mg Oral QHS Fayrene Helper, PA-C   45 mg at 02/11/20 2029   Current Outpatient Medications  Medication Sig Dispense Refill  . amantadine (SYMMETREL) 100 MG capsule Take 100 mg by mouth 2 (two) times daily.    . carbamazepine (TEGRETOL) 200 MG tablet Take 200 mg by mouth 2 (two) times daily.    . cetirizine (ZYRTEC) 10 MG tablet Take 10 mg by mouth daily.    . chlorproMAZINE (THORAZINE) 100 MG tablet Take 100 mg by mouth 3 (three) times daily.    . hydrOXYzine (ATARAX/VISTARIL) 25 MG tablet Take 25 mg by mouth 3 (three) times daily.     Marland Kitchen LORazepam (ATIVAN) 2 MG tablet Take 2.5 mg by mouth 2 (two) times daily.     . mirtazapine (REMERON) 30 MG tablet Take 45 mg by mouth at bedtime. Mat give second dose if awake in night    . fluticasone (FLONASE) 50 MCG/ACT nasal spray Place 2 sprays into the nose daily. 2 sprays to each nostril at bedtime. (Patient not taking: Reported on 01/25/2020) 16 g 2  . oxyCODONE-acetaminophen (PERCOCET) 5-325 MG per tablet Take 2 tablets by mouth every 4 (four) hours as needed for severe pain. (Patient not taking: Reported on 01/21/2020) 40 tablet 0    Lab Results: No results found for this or any previous visit (from the past 48 hour(s)).  Blood Alcohol level:  Lab Results  Component Value Date   ETH <10 01/25/2020   ETH <10 01/21/2020    Physical Findings: AIMS:  , ,  ,  ,    CIWA:    COWS:     Musculoskeletal: Strength & Muscle Tone: within normal limits Gait & Station: normal Patient leans: N/A  Psychiatric Specialty Exam: Physical  Exam Vitals and nursing note reviewed. Exam conducted with a chaperone present (Sitter at bed side).  Constitutional:      Appearance: He is well-developed.  HENT:     Head: Normocephalic.  Cardiovascular:     Rate and Rhythm: Normal rate.  Pulmonary:     Effort: Pulmonary effort is normal.  Neurological:     Mental Status: He is alert and oriented to person, place, and time.  Psychiatric:  Cognition and Memory: Cognition is impaired.        Judgment: Judgment is inappropriate.     Review of Systems  Constitutional: Negative.   HENT: Negative.   Eyes: Negative.   Respiratory: Negative.   Cardiovascular: Negative.   Gastrointestinal: Negative.   Genitourinary: Negative.   Musculoskeletal: Negative.   Skin: Negative.   Neurological: Negative.   Psychiatric/Behavioral: Negative for behavioral problems (not present during assessment but episodic and unpredictable), dysphoric mood, hallucinations, self-injury and suicidal ideas.    Blood pressure (!) 140/98, pulse 95, temperature (!) 97.5 F (36.4 C), temperature source Oral, resp. rate 18, SpO2 100 %.There is no height or weight on file to calculate BMI.  General Appearance: Casual and Fairly Groomed  Eye Contact:  Fair  Speech:  Slow  Volume:  Normal  Mood:  Euthymic  Affect:  Congruent  Thought Process:  Goal Directed and Descriptions of Associations: Loose  Orientation:  Other:  oriented to self and place  Thought Content:  Logical  Suicidal Thoughts:  No  Homicidal Thoughts:  No  Memory:  Immediate;   Poor  Judgement:  Impaired  Insight:  Lacking  Psychomotor Activity:  Normal  Concentration:  Concentration: Poor and Attention Span: Poor  Recall:  Poor  Fund of Knowledge:  Poor  Language:  Poor but able to make himself understood  Akathisia:  No  Handed:  Right  AIMS (if indicated):     Assets:  Financial Resources/Insurance Housing Intimacy Leisure Time Physical Health Resilience Social Support   ADL's:  Intact  Cognition:  Impaired,  Severe  Sleep:   < 6 hours   Treatment Plan Summary: Daily contact with patient to assess and evaluate symptoms and progress in treatment. Inpatient psychiatric treatment or placement that is safe for patient.  Patients behavior has improved but continues to have episodic behavioral outburst which may be a result of being restricted to ED with nothing to do.  Patient awaiting inpatient psychiatric or appropriate facility placement that is safe for patient.     Per Social Work, his guardian was approved to bring patients stuffed animals and [employer] food today. He continues to meet the need for inpatient and SW continues to actively seek placement.    No change in treatment plan at this time  Chales Abrahams, NP 02/12/2020, 11:39 AM

## 2020-02-12 NOTE — ED Notes (Signed)
Created [employer] for [employer]. Please add any additional songs he likes onto list to avoid agitation.   Username: [email or web address] Password: [email or web address]

## 2020-02-12 NOTE — BH Assessment (Addendum)
[facility] Assessment Progress Note  Per Ophelia Shoulder, NP this pt continues to require psychiatric hospitalization, unless a facility can be found that can keep him safe and attend to his needs in the community.  At 11:59, after reviewing pt's nursing notes and psychiatry consult, this writer called [name].  I reported to her that pt has had a couple instances when he became loud, but that he cooperated with medications and was redirectable.  I also noted that earlier today I spoke to Adonis Huguenin, who will be bringing pt's stuffed animals to the ED along with food from [employer].  Please note that the latter has been staffed with Tia Alert, who agrees to the plan.  [name] reports that pt's assigned [place] START worker is Software engineer.  Charmaine will be reviewing pt with her team to see if they can offer him a crisis bed, but [name] thinks that this is unlikely.  She does report, however, that Danelle Earthly has toured the [facility] group home, and has approved them.  They have completed intake process, and tentatively plan to accept pt next Monday, 02/16/2020.  [name] adds that Noel's court date to relinquish guardianship of pt is scheduled for Wednesday, 03/03/2020.  As of this writing, final disposition is pending.  Pt's nurse, [place], has been notified.  Doylene Canning, [place] [facility] Coordinator [phone]

## 2020-02-13 DIAGNOSIS — F840 Autistic disorder: Secondary | ICD-10-CM | POA: Diagnosis not present

## 2020-02-13 MED ORDER — ZIPRASIDONE MESYLATE 20 MG IM SOLR
20.0000 mg | Freq: Once | INTRAMUSCULAR | Status: AC
  Administered 2020-02-13: 20 mg via INTRAMUSCULAR
  Filled 2020-02-13: qty 20

## 2020-02-13 NOTE — NC FL2 (Signed)
[place] LEVEL OF CARE SCREENING TOOL     IDENTIFICATION  Patient Name: Jonathan Bird Birthdate: 11/20/1991 Sex: male Admission Date (Current Location): 01/25/2020  [place] and [place] Number:  Kathleen Argue [id number] [place] and Address:  [facility],  [place] [address] [place]      Provider Number: [id number]  Attending Physician Name and Address:  Default, Provider, MD  Relative Name and Phone Number:  Edrick Oh (guardian) [phone]    Current Level of Care: Hospital Recommended Level of Care: Other (Comment)(Group Home) Prior Approval Number:    Date Approved/Denied:   PASRR Number:    Discharge Plan: Other (Comment)(Group Home)    Current Diagnoses: Patient Active Problem List   Diagnosis Date Noted  . 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 Intellectual disability 02/04/2020  . Outbursts of anger   . Autism disorder 01/22/2020  . Aggression 01/22/2020    Orientation RESPIRATION BLADDER Height & Weight     Self, Time, Place, Situation  Normal Continent Weight:   Height:     BEHAVIORAL SYMPTOMS/MOOD NEUROLOGICAL BOWEL NUTRITION STATUS      Continent Diet(Regular)  AMBULATORY STATUS COMMUNICATION OF NEEDS Skin   Independent Verbally Normal                       Personal Care Assistance Level of Assistance  Bathing, Feeding, Dressing Bathing Assistance: Independent Feeding assistance: Independent Dressing Assistance: Independent     Functional Limitations Info  Sight, Speech, Hearing Sight Info: Adequate Hearing Info: Adequate Speech Info: Adequate    SPECIAL CARE FACTORS FREQUENCY                       Contractures Contractures Info: Not present    Additional Factors Info  Code Status, Allergies, Psychotropic Code Status Info: Full Allergies Info: NKA Psychotropic Info: See AVS, med list         Current Medications (02/13/2020):  This is the current hospital active medication  list Current Facility-Administered Medications  Medication Dose Route Frequency Provider Last Rate Last Admin  . amantadine (SYMMETREL) capsule 100 mg  100 mg Oral BID Domenic Moras, PA-C   100 mg at 02/13/20 6389  . carbamazepine (TEGRETOL) chewable tablet 200 mg  200 mg Oral BID Rankin, Shuvon B, NP   200 mg at 02/13/20 0906  . chlorproMAZINE (THORAZINE) injection 25 mg  25 mg Intramuscular TID PRN Rankin, Shuvon B, NP   25 mg at 02/13/20 0405  . chlorproMAZINE (THORAZINE) tablet 100 mg  100 mg Oral TID Domenic Moras, PA-C   100 mg at 02/13/20 3734  . lidocaine-EPINEPHrine-tetracaine (LET) topical gel  3 mL Topical Once Domenic Moras, PA-C      . loratadine (CLARITIN) tablet 10 mg  10 mg Oral Daily Domenic Moras, PA-C   10 mg at 02/13/20 2876  . LORazepam (ATIVAN) tablet 2.5 mg  2.5 mg Oral Q4H PRN Domenic Moras, PA-C   2.5 mg at 02/13/20 8115  . mirtazapine (REMERON) tablet 45 mg  45 mg Oral QHS Domenic Moras, PA-C   45 mg at 02/12/20 1944   Current Outpatient Medications  Medication Sig Dispense Refill  . amantadine (SYMMETREL) 100 MG capsule Take 100 mg by mouth 2 (two) times daily.    . carbamazepine (TEGRETOL) 200 MG tablet Take 200 mg by mouth 2 (two) times daily.    . cetirizine (ZYRTEC) 10 MG tablet Take 10 mg by mouth daily.    Marland Kitchen  chlorproMAZINE (THORAZINE) 100 MG tablet Take 100 mg by mouth 3 (three) times daily.    . hydrOXYzine (ATARAX/VISTARIL) 25 MG tablet Take 25 mg by mouth 3 (three) times daily.     Marland Kitchen LORazepam (ATIVAN) 2 MG tablet Take 2.5 mg by mouth 2 (two) times daily.     . mirtazapine (REMERON) 30 MG tablet Take 45 mg by mouth at bedtime. Mat give second dose if awake in night    . fluticasone (FLONASE) 50 MCG/ACT nasal spray Place 2 sprays into the nose daily. 2 sprays to each nostril at bedtime. (Patient not taking: Reported on 01/25/2020) 16 g 2  . oxyCODONE-acetaminophen (PERCOCET) 5-325 MG per tablet Take 2 tablets by mouth every 4 (four) hours as needed for severe pain.  (Patient not taking: Reported on 01/21/2020) 40 tablet 0     Discharge Medications: Please see discharge summary for a list of discharge medications.  Relevant Imaging Results:  Relevant Lab Results:   Additional Information SS# [id number]  Montine Circle, LCSW

## 2020-02-13 NOTE — Consult Note (Signed)
[facility] Psych ED Progress Note  02/13/2020 11:49 AM Jonathan Bird  MRN:  [id number] Subjective:  Patient assessed by nurse practitioner. Patient minimally verbally  responsive but appears at baseline.  Patient appears frustrated at times today per ED staff, requests "[employer] hamburgers."  Principal Problem: Aggression Diagnosis:  Principal Problem:   Aggression Active Problems:   Autism disorder   Fetal alcohol syndrome   Intellectual disability  Total Time spent with patient: 15 minutes  Past Psychiatric History: Autism disorder, fetal alcohol syndrome, intellectual disability   Past Medical History:  Past Medical History:  Diagnosis Date  . Autism    outbursts  . Autism spectrum   . Bipolar disorder ([facility])   . Diabetes mellitus without complication ([facility])    boarderline  . Hypertension     Past Surgical History:  Procedure Laterality Date  . NO PAST SURGERIES    . TOOTH EXTRACTION N/A 05/25/2014   Procedure: EXTRACTION WISDOM TEETH, Extraction teeth # 1, 16, 17, 32.;  Surgeon: Georgia Lopes, DDS;  Location: [facility] OR;  Service: Oral Surgery;  Laterality: N/A;   Family History: History reviewed. No pertinent family history. Family Psychiatric  History: Unknown Social History:  Social History   Substance and Sexual Activity  Alcohol Use No     Social History   Substance and Sexual Activity  Drug Use No    Social History   Socioeconomic History  . Marital status: Single    Spouse name: Not on file  . Number of children: Not on file  . Years of education: Not on file  . Highest education level: Not on file  Occupational History  . Not on file  Tobacco Use  . Smoking status: Never Smoker  . Smokeless tobacco: Never Used  Substance and Sexual Activity  . Alcohol use: No  . Drug use: No  . Sexual activity: Never  Other Topics Concern  . Not on file  Social History Narrative  . Not on file   Social Determinants of Health   Financial Resource Strain:   .  Difficulty of Paying Living Expenses: Not on file  Food Insecurity:   . Worried About Programme researcher, broadcasting/film/video in the Last Year: Not on file  . Ran Out of Food in the Last Year: Not on file  Transportation Needs:   . Lack of Transportation (Medical): Not on file  . Lack of Transportation (Non-Medical): Not on file  Physical Activity:   . Days of Exercise per Week: Not on file  . Minutes of Exercise per Session: Not on file  Stress:   . Feeling of Stress : Not on file  Social Connections:   . Frequency of Communication with Friends and Family: Not on file  . Frequency of Social Gatherings with Friends and Family: Not on file  . Attends Religious Services: Not on file  . Active Member of Clubs or Organizations: Not on file  . Attends Banker Meetings: Not on file  . Marital Status: Not on file    Sleep: Good  Appetite:  Good  Current Medications: Current Facility-Administered Medications  Medication Dose Route Frequency Provider Last Rate Last Admin  . amantadine (SYMMETREL) capsule 100 mg  100 mg Oral BID Fayrene Helper, PA-C   100 mg at 02/13/20 8032  . carbamazepine (TEGRETOL) chewable tablet 200 mg  200 mg Oral BID Rankin, Shuvon B, NP   200 mg at 02/13/20 0906  . chlorproMAZINE (THORAZINE) injection 25 mg  25 mg  Intramuscular TID PRN Rankin, Shuvon B, NP   25 mg at 02/13/20 0405  . chlorproMAZINE (THORAZINE) tablet 100 mg  100 mg Oral TID Domenic Moras, PA-C   100 mg at 02/13/20 4034  . lidocaine-EPINEPHrine-tetracaine (LET) topical gel  3 mL Topical Once Domenic Moras, PA-C      . loratadine (CLARITIN) tablet 10 mg  10 mg Oral Daily Domenic Moras, PA-C   10 mg at 02/13/20 7425  . LORazepam (ATIVAN) tablet 2.5 mg  2.5 mg Oral Q4H PRN Domenic Moras, PA-C   2.5 mg at 02/13/20 1125  . mirtazapine (REMERON) tablet 45 mg  45 mg Oral QHS Domenic Moras, PA-C   45 mg at 02/12/20 1944   Current Outpatient Medications  Medication Sig Dispense Refill  . amantadine (SYMMETREL) 100 MG capsule  Take 100 mg by mouth 2 (two) times daily.    . carbamazepine (TEGRETOL) 200 MG tablet Take 200 mg by mouth 2 (two) times daily.    . cetirizine (ZYRTEC) 10 MG tablet Take 10 mg by mouth daily.    . chlorproMAZINE (THORAZINE) 100 MG tablet Take 100 mg by mouth 3 (three) times daily.    . hydrOXYzine (ATARAX/VISTARIL) 25 MG tablet Take 25 mg by mouth 3 (three) times daily.     Marland Kitchen LORazepam (ATIVAN) 2 MG tablet Take 2.5 mg by mouth 2 (two) times daily.     . mirtazapine (REMERON) 30 MG tablet Take 45 mg by mouth at bedtime. Mat give second dose if awake in night    . fluticasone (FLONASE) 50 MCG/ACT nasal spray Place 2 sprays into the nose daily. 2 sprays to each nostril at bedtime. (Patient not taking: Reported on 01/25/2020) 16 g 2  . oxyCODONE-acetaminophen (PERCOCET) 5-325 MG per tablet Take 2 tablets by mouth every 4 (four) hours as needed for severe pain. (Patient not taking: Reported on 01/21/2020) 40 tablet 0    Lab Results: No results found for this or any previous visit (from the past 48 hour(s)).  Blood Alcohol level:  Lab Results  Component Value Date   ETH <10 01/25/2020   ETH <10 01/21/2020    Physical Findings: AIMS:  , ,  ,  ,    CIWA:    COWS:     Musculoskeletal: Strength & Muscle Tone: within normal limits Gait & Station: normal Patient leans: N/A  Psychiatric Specialty Exam: Physical Exam Vitals and nursing note reviewed.  Constitutional:      Appearance: He is well-developed.  HENT:     Head: Normocephalic.  Cardiovascular:     Rate and Rhythm: Normal rate.  Pulmonary:     Effort: Pulmonary effort is normal.  Neurological:     Mental Status: He is alert and oriented to person, place, and time.  Psychiatric:        Mood and Affect: Affect is labile.        Speech: Speech is delayed.        Behavior: Behavior is cooperative.        Thought Content: Thought content normal.        Cognition and Memory: Cognition is impaired.        Judgment: Judgment is  impulsive and inappropriate.     Review of Systems  Blood pressure (!) 148/94, pulse (!) 101, temperature (!) 97.4 F (36.3 C), temperature source Oral, resp. rate 20, SpO2 100 %.There is no height or weight on file to calculate BMI.  General Appearance: Casual and Fairly Groomed  Eye  Contact:  Minimal  Speech:  Slow  Volume:  Normal  Mood:  Euthymic  Affect:  Labile  Thought Process:  Goal Directed  Orientation:  Other:  oriented to self  Thought Content:  Logical  Suicidal Thoughts:  unable to assess  Homicidal Thoughts:  unable to assess  Memory:  NA  Judgement:  Impaired  Insight:  Lacking  Psychomotor Activity:  Normal  Concentration:  Concentration: Poor and Attention Span: Poor  Recall:  NA  Fund of Knowledge:  Poor  Language:  Poor  Akathisia:  No  Handed:  Right  AIMS (if indicated):     Assets:  Health and safety inspector Housing Leisure Time Physical Health Resilience Social Support  ADL's:  Intact  Cognition:  Impaired,  Severe  Sleep:         Treatment Plan Summary: Daily contact with patient to assess and evaluate symptoms and progress in treatment  Patient continues to have labile and irritable behavior. Patient redirectable in emergency department setting. Patient plan to discharge to appropriate facility once availability secured.    Patrcia Dolly, FNP 02/13/2020, 11:49 AM

## 2020-02-13 NOTE — ED Notes (Signed)
Pt coloring in room, listening to music play list, pacing around room at times, evening  meds given, sitter at bedside. I will continue to monitor.

## 2020-02-13 NOTE — ED Provider Notes (Signed)
Emergency Medicine Observation Re-evaluation Note  Jonathan Bird is a 29 y.o. male, seen on rounds today.  Pt initially presented to the ED for complaints of Behavioral episode and Autistic Currently, the patient is cooperative.  Required geodon overnight.  Physical Exam  BP (!) 148/94 (BP Location: Right Arm)   Pulse (!) 101   Temp (!) 97.4 F (36.3 C) (Oral)   Resp 20   SpO2 100%  Physical Exam  ED Course / MDM  EKG:EKG Interpretation  Date/Time:  Monday January 26 2020 12:05:50 EST Ventricular Rate:  110 PR Interval:  166 QRS Duration: 96 QT Interval:  348 QTC Calculation: 470 R Axis:   92 Text Interpretation: Sinus tachycardia Rightward axis Borderline ECG No acute changes No significant change since last tracing Confirmed by Derwood Kaplan [phone]) on 01/26/2020 11:29:47 PM    I have reviewed the labs performed to date as well as medications administered while in observation.  Recent changes in the last 24 hours include:.  Notes from yesterday, continued attempts at placement as patient seems to slowly improve. Plan  Current plan is for sedating or calming meds as needed. Patient is under full IVC at this time.   Sabas Sous, MD 02/13/20 [phone]

## 2020-02-13 NOTE — Progress Notes (Addendum)
Even after giving PRN PO ativan 2.5mg , patient escalated quickly. Spoke to Dr. Eudelia Bunch and one time dose IM geodon ordered and given with security present. Will continue to monitor.   Patient resting comfortably at this time.

## 2020-02-13 NOTE — ED Notes (Signed)
Jonathan Bird has been calm majority of the day. Had recent outburst screaming and biting himself on the arm. Pt was also hitting head on to wall and throwing things in room. Currently laying in bed screaming out intermediately.

## 2020-02-14 DIAGNOSIS — R4689 Other symptoms and signs involving appearance and behavior: Secondary | ICD-10-CM | POA: Diagnosis not present

## 2020-02-14 DIAGNOSIS — F840 Autistic disorder: Secondary | ICD-10-CM | POA: Diagnosis not present

## 2020-02-14 MED ORDER — ZIPRASIDONE MESYLATE 20 MG IM SOLR
20.0000 mg | Freq: Once | INTRAMUSCULAR | Status: AC
  Administered 2020-02-14: 20 mg via INTRAMUSCULAR
  Filled 2020-02-14: qty 20

## 2020-02-14 MED ORDER — LORAZEPAM 2 MG/ML IJ SOLN
2.0000 mg | Freq: Once | INTRAMUSCULAR | Status: AC
  Administered 2020-02-14: 21:00:00 2 mg via INTRAMUSCULAR
  Filled 2020-02-14: qty 1

## 2020-02-14 MED ORDER — STERILE WATER FOR INJECTION IJ SOLN
INTRAMUSCULAR | Status: AC
  Administered 2020-02-14: 10 mL
  Filled 2020-02-14: qty 10

## 2020-02-14 NOTE — Progress Notes (Signed)
CSW spoke with [name], the patient's Jonathan Bird at [phone] to confirm the discharge plan. [name] confirms the patient will be accepted into the new care facility on Monday 02/16/2020.  Edwin Dada, MSW, LCSW-A Transitions of Care  Clinical Social Worker  [facility] Emergency Departments  Medical ICU [phone]

## 2020-02-14 NOTE — Progress Notes (Signed)
02/14/20  1145  Patient yelling, grabbing at staff, threw stuff animal in hall way.

## 2020-02-14 NOTE — Progress Notes (Signed)
02/14/2020  1336  Notified MD of patients  BP 128/103. Waiting for orders.

## 2020-02-14 NOTE — ED Notes (Signed)
Patient up  Singing and walking around room

## 2020-02-14 NOTE — Consult Note (Signed)
[facility] Psych ED Progress Note  02/14/2020 3:07 PM Jonathan Bird  MRN:  [id number]   Subjective: Repeats, "how are you today".  Patient with speech limitations but is able to make himself understood.   Jonathan Bird, 29 y.o., male patient seen psychiatric follow up via tele psych by this provider, Dr. Parke Poisson; and chart reviewed on 02/14/20.  On evaluation Jonathan Bird patient standing near the door to his exam room, dancing. When asked how he is doing he responds "okay".  He continues to dance, no background music is heard.  He has what appears to be pictures created with crayones on his wall.  When asked about this, he moves towards the wall and points at the pictures.     During evaluation Jonathan Bird is alert, oriented to person and place.  Currently he is calm/cooperative.  He does not appear to be responding to internal/external stimuli or delusional thoughts.  When asked is he wants to harm himself or others he denies this and repeats, "Be good, be good".  The patient coontinues to have episodes of behavioral outburst.        Principal Problem: Aggression Diagnosis:  Principal Problem:   Aggression Active Problems:   Autism disorder   Fetal alcohol syndrome   Intellectual disability  Total Time spent with patient: 15 minutes  Past Psychiatric History: Autism disorder, fetal alcohol syndrome, intellectual disability  Past Medical History:  Past Medical History:  Diagnosis Date  . Autism    outbursts  . Autism spectrum   . Bipolar disorder ([place])   . Diabetes mellitus without complication ([place])    boarderline  . Hypertension     Past Surgical History:  Procedure Laterality Date  . NO PAST SURGERIES    . TOOTH EXTRACTION N/A 05/25/2014   Procedure: EXTRACTION WISDOM TEETH, Extraction teeth # 1, 16, 17, 32.;  Surgeon: Gae Bon, DDS;  Location: [facility];  Service: Oral Surgery;  Laterality: N/A;   Family History: History reviewed. No pertinent family history. Family  Psychiatric  History: Unknown Social History:  Social History   Substance and Sexual Activity  Alcohol Use No     Social History   Substance and Sexual Activity  Drug Use No    Social History   Socioeconomic History  . Marital status: Single    Spouse name: Not on file  . Number of children: Not on file  . Years of education: Not on file  . Highest education level: Not on file  Occupational History  . Not on file  Tobacco Use  . Smoking status: Never Smoker  . Smokeless tobacco: Never Used  Substance and Sexual Activity  . Alcohol use: No  . Drug use: No  . Sexual activity: Never  Other Topics Concern  . Not on file  Social History Narrative  . Not on file   Social Determinants of Health   Financial Resource Strain:   . Difficulty of Paying Living Expenses: Not on file  Food Insecurity:   . Worried About Charity fundraiser in the Last Year: Not on file  . Ran Out of Food in the Last Year: Not on file  Transportation Needs:   . Lack of Transportation (Medical): Not on file  . Lack of Transportation (Non-Medical): Not on file  Physical Activity:   . Days of Exercise per Week: Not on file  . Minutes of Exercise per Session: Not on file  Stress:   . Feeling of  Stress : Not on file  Social Connections:   . Frequency of Communication with Friends and Family: Not on file  . Frequency of Social Gatherings with Friends and Family: Not on file  . Attends Religious Services: Not on file  . Active Member of Clubs or Organizations: Not on file  . Attends Banker Meetings: Not on file  . Marital Status: Not on file    Sleep: Good  Appetite:  Good  Current Medications: Current Facility-Administered Medications  Medication Dose Route Frequency Provider Last Rate Last Admin  . amantadine (SYMMETREL) capsule 100 mg  100 mg Oral BID Fayrene Helper, PA-C   100 mg at 02/14/20 0900  . carbamazepine (TEGRETOL) chewable tablet 200 mg  200 mg Oral BID Rankin,  Shuvon B, NP   200 mg at 02/14/20 0900  . chlorproMAZINE (THORAZINE) injection 25 mg  25 mg Intramuscular TID PRN Rankin, Shuvon B, NP   25 mg at 02/14/20 1144  . chlorproMAZINE (THORAZINE) tablet 100 mg  100 mg Oral TID Fayrene Helper, PA-C   100 mg at 02/14/20 0900  . lidocaine-EPINEPHrine-tetracaine (LET) topical gel  3 mL Topical Once Fayrene Helper, PA-C      . loratadine (CLARITIN) tablet 10 mg  10 mg Oral Daily Fayrene Helper, PA-C   10 mg at 02/14/20 0900  . LORazepam (ATIVAN) tablet 2.5 mg  2.5 mg Oral Q4H PRN Fayrene Helper, PA-C   2.5 mg at 02/14/20 0847  . mirtazapine (REMERON) tablet 45 mg  45 mg Oral QHS Fayrene Helper, PA-C   45 mg at 02/13/20 2043   Current Outpatient Medications  Medication Sig Dispense Refill  . amantadine (SYMMETREL) 100 MG capsule Take 100 mg by mouth 2 (two) times daily.    . carbamazepine (TEGRETOL) 200 MG tablet Take 200 mg by mouth 2 (two) times daily.    . cetirizine (ZYRTEC) 10 MG tablet Take 10 mg by mouth daily.    . chlorproMAZINE (THORAZINE) 100 MG tablet Take 100 mg by mouth 3 (three) times daily.    . hydrOXYzine (ATARAX/VISTARIL) 25 MG tablet Take 25 mg by mouth 3 (three) times daily.     Marland Kitchen LORazepam (ATIVAN) 2 MG tablet Take 2.5 mg by mouth 2 (two) times daily.     . mirtazapine (REMERON) 30 MG tablet Take 45 mg by mouth at bedtime. Mat give second dose if awake in night    . fluticasone (FLONASE) 50 MCG/ACT nasal spray Place 2 sprays into the nose daily. 2 sprays to each nostril at bedtime. (Patient not taking: Reported on 01/25/2020) 16 g 2  . oxyCODONE-acetaminophen (PERCOCET) 5-325 MG per tablet Take 2 tablets by mouth every 4 (four) hours as needed for severe pain. (Patient not taking: Reported on 01/21/2020) 40 tablet 0    Lab Results: No results found for this or any previous visit (from the past 48 hour(s)).  Blood Alcohol level:  Lab Results  Component Value Date   ETH <10 01/25/2020   ETH <10 01/21/2020    Physical Findings: AIMS:  , ,  ,  ,     CIWA:    COWS:     Musculoskeletal: Strength & Muscle Tone: within normal limits Gait & Station: normal Patient leans: N/A  Psychiatric Specialty Exam: Physical Exam Vitals and nursing note reviewed. Exam conducted with a chaperone present (Sitter at bed side).  Constitutional:      Appearance: He is well-developed.  HENT:     Head: Normocephalic.  Cardiovascular:  Rate and Rhythm: Normal rate.  Pulmonary:     Effort: Pulmonary effort is normal.  Neurological:     Mental Status: He is alert and oriented to person, place, and time.  Psychiatric:        Cognition and Memory: Cognition is impaired.        Judgment: Judgment is inappropriate.     Review of Systems  Constitutional: Negative.   HENT: Negative.   Eyes: Negative.   Respiratory: Negative.   Cardiovascular: Negative.   Gastrointestinal: Negative.   Genitourinary: Negative.   Musculoskeletal: Negative.   Skin: Negative.   Neurological: Negative.   Psychiatric/Behavioral: Negative for behavioral problems (not present during assessment), hallucinations, self-injury and suicidal ideas.    Blood pressure (!) 128/103, pulse (!) 112, temperature (!) 97.5 F (36.4 C), temperature source Oral, resp. rate 16, SpO2 96 %.There is no height or weight on file to calculate BMI.  General Appearance: Casual and Fairly Groomed  Eye Contact:  Fair  Speech:  Slow  Volume:  Normal  Mood:  Euthymic  Affect:  Congruent  Thought Process:  Goal Directed and Descriptions of Associations: Loose  Orientation:  Other:  oriented to self and place  Thought Content:  Logical  Suicidal Thoughts:  No  Homicidal Thoughts:  No  Memory:  Immediate;   Poor  Judgement:  Impaired  Insight:  Lacking  Psychomotor Activity:  Normal  Concentration:  Concentration: Poor and Attention Span: Poor  Recall:  Poor  Fund of Knowledge:  Poor  Language:  Poor  Akathisia:  No  Handed:  Right  AIMS (if indicated):     Assets:  Financial  Resources/Insurance Housing Intimacy Leisure Time Physical Health Resilience Social Support  ADL's:  Intact  Cognition:  Impaired,  Severe  Sleep:   >6 hours   Treatment Plan Summary: Daily contact with patient to assess and evaluate symptoms and progress in treatment. Inpatient psychiatric treatment or placement that is safe for patient.  Patients behavior has improved but continues to have episodic behavioral outburst which may be a result of being restricted to ED with nothing to do.  Patient awaiting inpatient psychiatric or appropriate facility placement that is safe for patient.    Per SW, the patient is accepted at [facility], discharge is pending for 3/8.    No change in treatment plan at this time.  Report given to EDP. Dr. Dorathy Daft, NP 02/14/2020, 3:07 PM

## 2020-02-14 NOTE — ED Notes (Signed)
Patient in shower 

## 2020-02-14 NOTE — ED Notes (Signed)
Patient yelling loudly and singing in room. Patient redirectable by sitter

## 2020-02-15 DIAGNOSIS — F840 Autistic disorder: Secondary | ICD-10-CM | POA: Diagnosis not present

## 2020-02-15 LAB — RESPIRATORY PANEL BY RT PCR (FLU A&B, COVID)
Influenza A by PCR: NEGATIVE
Influenza B by PCR: NEGATIVE
SARS Coronavirus 2 by RT PCR: NEGATIVE

## 2020-02-15 MED ORDER — STERILE WATER FOR INJECTION IJ SOLN
INTRAMUSCULAR | Status: AC
  Administered 2020-02-15: 10 mL
  Filled 2020-02-15: qty 10

## 2020-02-15 MED ORDER — ZIPRASIDONE MESYLATE 20 MG IM SOLR
20.0000 mg | Freq: Once | INTRAMUSCULAR | Status: AC
  Administered 2020-02-15: 20 mg via INTRAMUSCULAR
  Filled 2020-02-15: qty 20

## 2020-02-15 NOTE — ED Notes (Signed)
Pt alert. Pt cooperative with medication. Pt walking around room. Making noise at times. Listening to music at times. Redirectable. Pt did become upset, yelling out, bit self, redirected. PRN Ativan 2.5 mg PO given.

## 2020-02-15 NOTE — Consult Note (Signed)
[facility] Psych ED Progress Note  02/15/2020 9:35 AM ROCKWELL ZENTZ  MRN:  [id number] Subjective:  Patient assessed by nurse practitioner. Patient patient alert.  Patient appears at baseline.  Patient appears with appropriate and cooperative mood today.  Patient appears to enjoy coloring in room. Principal Problem: Aggression Diagnosis:  Principal Problem:   Aggression Active Problems:   Autism disorder   Fetal alcohol syndrome   Intellectual disability  Total Time spent with patient: 15 minutes  Past Psychiatric History: Autism disorder, fetal alcohol syndrome, intellectual disability   Past Medical History:  Past Medical History:  Diagnosis Date  . Autism    outbursts  . Autism spectrum   . Bipolar disorder ([place])   . Diabetes mellitus without complication ([place])    boarderline  . Hypertension     Past Surgical History:  Procedure Laterality Date  . NO PAST SURGERIES    . TOOTH EXTRACTION N/A 05/25/2014   Procedure: EXTRACTION WISDOM TEETH, Extraction teeth # 1, 16, 17, 32.;  Surgeon: Gae Bon, DDS;  Location: [facility];  Service: Oral Surgery;  Laterality: N/A;   Family History: History reviewed. No pertinent family history. Family Psychiatric  History: Unknown Social History:  Social History   Substance and Sexual Activity  Alcohol Use No     Social History   Substance and Sexual Activity  Drug Use No    Social History   Socioeconomic History  . Marital status: Single    Spouse name: Not on file  . Number of children: Not on file  . Years of education: Not on file  . Highest education level: Not on file  Occupational History  . Not on file  Tobacco Use  . Smoking status: Never Smoker  . Smokeless tobacco: Never Used  Substance and Sexual Activity  . Alcohol use: No  . Drug use: No  . Sexual activity: Never  Other Topics Concern  . Not on file  Social History Narrative  . Not on file   Social Determinants of Health   Financial Resource Strain:   .  Difficulty of Paying Living Expenses: Not on file  Food Insecurity:   . Worried About Charity fundraiser in the Last Year: Not on file  . Ran Out of Food in the Last Year: Not on file  Transportation Needs:   . Lack of Transportation (Medical): Not on file  . Lack of Transportation (Non-Medical): Not on file  Physical Activity:   . Days of Exercise per Week: Not on file  . Minutes of Exercise per Session: Not on file  Stress:   . Feeling of Stress : Not on file  Social Connections:   . Frequency of Communication with Friends and Family: Not on file  . Frequency of Social Gatherings with Friends and Family: Not on file  . Attends Religious Services: Not on file  . Active Member of Clubs or Organizations: Not on file  . Attends Archivist Meetings: Not on file  . Marital Status: Not on file    Sleep: Good  Appetite:  Good  Current Medications: Current Facility-Administered Medications  Medication Dose Route Frequency Provider Last Rate Last Admin  . amantadine (SYMMETREL) capsule 100 mg  100 mg Oral BID Domenic Moras, PA-C   100 mg at 02/15/20 2951  . carbamazepine (TEGRETOL) chewable tablet 200 mg  200 mg Oral BID Rankin, Shuvon B, NP   200 mg at 02/15/20 0906  . chlorproMAZINE (THORAZINE) injection 25 mg  25 mg Intramuscular TID PRN Rankin, Shuvon B, NP   25 mg at 02/14/20 1144  . chlorproMAZINE (THORAZINE) tablet 100 mg  100 mg Oral TID Fayrene Helper, PA-C   100 mg at 02/15/20 0905  . lidocaine-EPINEPHrine-tetracaine (LET) topical gel  3 mL Topical Once Fayrene Helper, PA-C      . loratadine (CLARITIN) tablet 10 mg  10 mg Oral Daily Fayrene Helper, PA-C   10 mg at 02/15/20 0906  . LORazepam (ATIVAN) tablet 2.5 mg  2.5 mg Oral Q4H PRN Fayrene Helper, PA-C   2.5 mg at 02/14/20 2034  . mirtazapine (REMERON) tablet 45 mg  45 mg Oral QHS Fayrene Helper, PA-C   45 mg at 02/14/20 2035   Current Outpatient Medications  Medication Sig Dispense Refill  . amantadine (SYMMETREL) 100 MG capsule  Take 100 mg by mouth 2 (two) times daily.    . carbamazepine (TEGRETOL) 200 MG tablet Take 200 mg by mouth 2 (two) times daily.    . cetirizine (ZYRTEC) 10 MG tablet Take 10 mg by mouth daily.    . chlorproMAZINE (THORAZINE) 100 MG tablet Take 100 mg by mouth 3 (three) times daily.    . hydrOXYzine (ATARAX/VISTARIL) 25 MG tablet Take 25 mg by mouth 3 (three) times daily.     Marland Kitchen LORazepam (ATIVAN) 2 MG tablet Take 2.5 mg by mouth 2 (two) times daily.     . mirtazapine (REMERON) 30 MG tablet Take 45 mg by mouth at bedtime. Mat give second dose if awake in night    . fluticasone (FLONASE) 50 MCG/ACT nasal spray Place 2 sprays into the nose daily. 2 sprays to each nostril at bedtime. (Patient not taking: Reported on 01/25/2020) 16 g 2  . oxyCODONE-acetaminophen (PERCOCET) 5-325 MG per tablet Take 2 tablets by mouth every 4 (four) hours as needed for severe pain. (Patient not taking: Reported on 01/21/2020) 40 tablet 0    Lab Results:  Results for orders placed or performed during the hospital encounter of 01/25/20 (from the past 48 hour(s))  Respiratory Panel by RT PCR (Flu A&B, Covid) - Nasopharyngeal Swab     Status: None   Collection Time: 02/13/20 11:39 AM   Specimen: Nasopharyngeal Swab  Result Value Ref Range   SARS Coronavirus 2 by RT PCR NEGATIVE NEGATIVE    Comment: (NOTE) SARS-CoV-2 target nucleic acids are NOT DETECTED. The SARS-CoV-2 RNA is generally detectable in upper respiratoy specimens during the acute phase of infection. The lowest concentration of SARS-CoV-2 viral copies this assay can detect is 131 copies/mL. A negative result does not preclude SARS-Cov-2 infection and should not be used as the sole basis for treatment or other patient management decisions. A negative result may occur with  improper specimen collection/handling, submission of specimen other than nasopharyngeal swab, presence of viral mutation(s) within the areas targeted by this assay, and inadequate number  of viral copies (<131 copies/mL). A negative result must be combined with clinical observations, patient history, and epidemiological information. The expected result is Negative. Fact Sheet for Patients:  https://www.moore.com/ Fact Sheet for Healthcare Providers:  https://www.young.biz/ This test is not yet ap proved or cleared by the [place] FDA and  has been authorized for detection and/or diagnosis of SARS-CoV-2 by FDA under an Emergency Use Authorization (EUA). This EUA will remain  in effect (meaning this test can be used) for the duration of the COVID-19 declaration under Section 564(b)(1) of the Act, 21 U.S.C. section 360bbb-3(b)(1), unless the authorization is terminated or revoked  sooner.    Influenza A by PCR NEGATIVE NEGATIVE   Influenza B by PCR NEGATIVE NEGATIVE    Comment: (NOTE) The Xpert Xpress SARS-CoV-2/FLU/RSV assay is intended as an aid in  the diagnosis of influenza from Nasopharyngeal swab specimens and  should not be used as a sole basis for treatment. Nasal washings and  aspirates are unacceptable for Xpert Xpress SARS-CoV-2/FLU/RSV  testing. Fact Sheet for Patients: https://www.moore.com/ Fact Sheet for Healthcare Providers: https://www.young.biz/ This test is not yet approved or cleared by the [place] FDA and  has been authorized for detection and/or diagnosis of SARS-CoV-2 by  FDA under an Emergency Use Authorization (EUA). This EUA will remain  in effect (meaning this test can be used) for the duration of the  Covid-19 declaration under Section 564(b)(1) of the Act, 21  U.S.C. section 360bbb-3(b)(1), unless the authorization is  terminated or revoked. Performed at [facility], [address], [place], [place] [address]     Blood Alcohol level:  Lab Results  Component Value Date   ETH <10 01/25/2020   ETH <10 01/21/2020    Physical  Findings: AIMS:  , ,  ,  ,    CIWA:    COWS:     Musculoskeletal: Strength & Muscle Tone: within normal limits Gait & Station: normal Patient leans: N/A  Psychiatric Specialty Exam: Physical Exam Vitals and nursing note reviewed.  Constitutional:      Appearance: He is well-developed.  HENT:     Head: Normocephalic.  Cardiovascular:     Rate and Rhythm: Normal rate.  Pulmonary:     Effort: Pulmonary effort is normal.  Neurological:     Mental Status: He is alert and oriented to person, place, and time.  Psychiatric:        Mood and Affect: Affect is labile.        Speech: Speech is delayed.        Behavior: Behavior is cooperative.        Thought Content: Thought content normal.        Cognition and Memory: Cognition is impaired.        Judgment: Judgment is impulsive and inappropriate.     Review of Systems  Blood pressure (!) 176/100, pulse 96, temperature (!) 97.5 F (36.4 C), temperature source Oral, resp. rate 18, SpO2 100 %.There is no height or weight on file to calculate BMI.  General Appearance: Casual and Fairly Groomed  Eye Contact:  Minimal  Speech:  Slow  Volume:  Normal  Mood:  Euthymic  Affect:  Labile  Thought Process:  Goal Directed  Orientation:  Other:  oriented to self  Thought Content:  Logical  Suicidal Thoughts:  unable to assess  Homicidal Thoughts:  unable to assess  Memory:  NA  Judgement:  Impaired  Insight:  Lacking  Psychomotor Activity:  Normal  Concentration:  Concentration: Poor and Attention Span: Poor  Recall:  NA  Fund of Knowledge:  Poor  Language:  Poor  Akathisia:  No  Handed:  Right  AIMS (if indicated):     Assets:  Health and safety inspector Housing Leisure Time Physical Health Resilience Social Support  ADL's:  Intact  Cognition:  Impaired,  Severe  Sleep:         Treatment Plan Summary: Daily contact with patient to assess and evaluate symptoms and progress in treatment  Patient cooperative and  appropriate today. Patient continues to await placement.    Patrcia Dolly, FNP 02/15/2020, 9:35  AM

## 2020-02-16 ENCOUNTER — Encounter (HOSPITAL_COMMUNITY): Payer: Self-pay | Admitting: Registered Nurse

## 2020-02-16 DIAGNOSIS — F840 Autistic disorder: Secondary | ICD-10-CM | POA: Diagnosis not present

## 2020-02-16 MED ORDER — MIRTAZAPINE 45 MG PO TABS: 45.0000 mg | ORAL_TABLET | Freq: Every day | ORAL | 0 refills | Status: DC

## 2020-02-16 MED ORDER — CARBAMAZEPINE 200 MG PO TABS: 200.0000 mg | ORAL_TABLET | Freq: Two times a day (BID) | ORAL | 0 refills | Status: DC

## 2020-02-16 MED ORDER — AMANTADINE HCL 100 MG PO CAPS: 100.0000 mg | ORAL_CAPSULE | Freq: Two times a day (BID) | ORAL | 0 refills | Status: DC

## 2020-02-16 MED ORDER — CETIRIZINE HCL 10 MG PO TABS: 10.0000 mg | ORAL_TABLET | Freq: Every day | ORAL | 0 refills | Status: DC

## 2020-02-16 MED ORDER — CHLORPROMAZINE HCL 100 MG PO TABS: 100.0000 mg | ORAL_TABLET | Freq: Three times a day (TID) | ORAL | 0 refills | Status: DC

## 2020-02-16 MED ORDER — LORAZEPAM 1 MG PO TABS
1.0000 mg | ORAL_TABLET | Freq: Four times a day (QID) | ORAL | Status: AC | PRN
  Administered 2020-02-16: 1 mg via ORAL
  Filled 2020-02-16: qty 1

## 2020-02-16 MED ORDER — OLANZAPINE 10 MG PO TBDP
10.0000 mg | ORAL_TABLET | Freq: Three times a day (TID) | ORAL | Status: DC | PRN
  Administered 2020-02-16: 10 mg via ORAL
  Filled 2020-02-16: qty 1

## 2020-02-16 MED ORDER — ZIPRASIDONE MESYLATE 20 MG IM SOLR
20.0000 mg | Freq: Four times a day (QID) | INTRAMUSCULAR | Status: AC | PRN
  Administered 2020-02-16: 20 mg via INTRAMUSCULAR

## 2020-02-16 MED ORDER — LORAZEPAM 2 MG PO TABS: 2.5000 mg | ORAL_TABLET | Freq: Two times a day (BID) | ORAL | 0 refills | Status: DC

## 2020-02-16 MED ORDER — STERILE WATER FOR INJECTION IJ SOLN
INTRAMUSCULAR | Status: AC
  Filled 2020-02-16: qty 10

## 2020-02-16 MED ORDER — CHLORPROMAZINE HCL 100 MG PO TABS: 100.0000 mg | ORAL_TABLET | Freq: Three times a day (TID) | ORAL | 0 refills | Status: AC

## 2020-02-16 MED ORDER — LORAZEPAM 2 MG PO TABS: ORAL_TABLET | ORAL | 0 refills | Status: DC

## 2020-02-16 MED ORDER — ZIPRASIDONE MESYLATE 20 MG IM SOLR
INTRAMUSCULAR | Status: AC
  Filled 2020-02-16: qty 20

## 2020-02-16 MED ORDER — CETIRIZINE HCL 10 MG PO TABS: 10.0000 mg | ORAL_TABLET | Freq: Every day | ORAL | 0 refills | Status: AC

## 2020-02-16 NOTE — ED Provider Notes (Signed)
Emergency Medicine Observation Re-evaluation Note  Jonathan Bird is a 29 y.o. male, seen on rounds today.  Pt initially presented to the ED for complaints of Behavioral episode and Autistic Currently, the patient is stable  Physical Exam  BP (!) 132/94 (BP Location: Right Arm)   Pulse 100   Temp 97.8 F (36.6 C) (Oral)   Resp 19   SpO2 100%  Physical Exam Pt alert in nad ED Course / MDM  EKG:EKG Interpretation  Date/Time:  Monday January 26 2020 12:05:50 EST Ventricular Rate:  110 PR Interval:  166 QRS Duration: 96 QT Interval:  348 QTC Calculation: 470 R Axis:   92 Text Interpretation: Sinus tachycardia Rightward axis Borderline ECG No acute changes No significant change since last tracing Confirmed by Derwood Kaplan [phone]) on 01/26/2020 11:29:47 PM     Plan  placement   Bethann Berkshire, MD 02/16/20 [phone]

## 2020-02-16 NOTE — Consult Note (Signed)
[facility] Psych ED Discharge  02/16/2020 1:39 PM DEBRA CALABRETTA  MRN:  [id number] Principal Problem: Aggression Discharge Diagnoses: Principal Problem:   Aggression Active Problems:   Autism disorder   Fetal alcohol syndrome   Intellectual disability   Subjective: Subjective:  Patient sleeping this morning unable to complete full assessment.  Patient is to be discharged today going to [facility].  His guardian will be picking him up.  Total Time spent with patient: 15 minutes Autism disorder, fetal alcohol syndrome, intellectual disability Past Psychiatric History:   Past Medical History:  Past Medical History:  Diagnosis Date  . Autism    outbursts  . Autism spectrum   . Bipolar disorder (HCC)   . Diabetes mellitus without complication (HCC)    boarderline  . Hypertension     Past Surgical History:  Procedure Laterality Date  . NO PAST SURGERIES    . TOOTH EXTRACTION N/A 05/25/2014   Procedure: EXTRACTION WISDOM TEETH, Extraction teeth # 1, 16, 17, 32.;  Surgeon: Georgia Lopes, DDS;  Location: [facility] OR;  Service: Oral Surgery;  Laterality: N/A;   Family History: History reviewed. No pertinent family history. Family Psychiatric  History: Unknown Social History:  Social History   Substance and Sexual Activity  Alcohol Use No     Social History   Substance and Sexual Activity  Drug Use No    Social History   Socioeconomic History  . Marital status: Single    Spouse name: Not on file  . Number of children: Not on file  . Years of education: Not on file  . Highest education level: Not on file  Occupational History  . Not on file  Tobacco Use  . Smoking status: Never Smoker  . Smokeless tobacco: Never Used  Substance and Sexual Activity  . Alcohol use: No  . Drug use: No  . Sexual activity: Never  Other Topics Concern  . Not on file  Social History Narrative  . Not on file   Social Determinants of Health   Financial Resource Strain:   .  Difficulty of Paying Living Expenses: Not on file  Food Insecurity:   . Worried About Programme researcher, broadcasting/film/video in the Last Year: Not on file  . Ran Out of Food in the Last Year: Not on file  Transportation Needs:   . Lack of Transportation (Medical): Not on file  . Lack of Transportation (Non-Medical): Not on file  Physical Activity:   . Days of Exercise per Week: Not on file  . Minutes of Exercise per Session: Not on file  Stress:   . Feeling of Stress : Not on file  Social Connections:   . Frequency of Communication with Friends and Family: Not on file  . Frequency of Social Gatherings with Friends and Family: Not on file  . Attends Religious Services: Not on file  . Active Member of Clubs or Organizations: Not on file  . Attends Banker Meetings: Not on file  . Marital Status: Not on file    Has this patient used any form of tobacco in the last 30 days? (Cigarettes, Smokeless Tobacco, Cigars, and/or Pipes) Prescription not provided because: Does not use tobacco products  Current Medications: Current Facility-Administered Medications  Medication Dose Route Frequency Provider Last Rate Last Admin  . amantadine (SYMMETREL) capsule 100 mg  100 mg Oral BID Fayrene Helper, PA-C   100 mg at 02/16/20 1137  . carbamazepine (TEGRETOL) chewable tablet 200 mg  200 mg Oral BID Valincia Touch B, NP   200 mg at 02/16/20 1137  . chlorproMAZINE (THORAZINE) injection 25 mg  25 mg Intramuscular TID PRN Adams Hinch B, NP   25 mg at 02/16/20 0615  . chlorproMAZINE (THORAZINE) tablet 100 mg  100 mg Oral TID Domenic Moras, PA-C   100 mg at 02/16/20 1138  . lidocaine-EPINEPHrine-tetracaine (LET) topical gel  3 mL Topical Once Domenic Moras, PA-C      . loratadine (CLARITIN) tablet 10 mg  10 mg Oral Daily Domenic Moras, PA-C   10 mg at 02/16/20 1137  . LORazepam (ATIVAN) tablet 2.5 mg  2.5 mg Oral Q4H PRN Domenic Moras, PA-C   2.5 mg at 02/15/20 2030  . mirtazapine (REMERON) tablet 45 mg  45 mg Oral QHS  Domenic Moras, PA-C   45 mg at 02/15/20 2030  . OLANZapine zydis (ZYPREXA) disintegrating tablet 10 mg  10 mg Oral Q8H PRN Orpah Greek, MD   10 mg at 02/16/20 0703   Current Outpatient Medications  Medication Sig Dispense Refill  . amantadine (SYMMETREL) 100 MG capsule Take 1 capsule (100 mg total) by mouth 2 (two) times daily. 60 capsule 0  . carbamazepine (TEGRETOL) 200 MG tablet Take 1 tablet (200 mg total) by mouth 2 (two) times daily. 60 tablet 0  . cetirizine (ZYRTEC) 10 MG tablet Take 1 tablet (10 mg total) by mouth daily. 30 tablet 0  . chlorproMAZINE (THORAZINE) 100 MG tablet Take 1 tablet (100 mg total) by mouth 3 (three) times daily. 90 tablet 0  . LORazepam (ATIVAN) 2 MG tablet Take 1.5 tablets (3 mg total) by mouth 2 (two) times daily. 30 tablet 0  . mirtazapine (REMERON) 45 MG tablet Take 1 tablet (45 mg total) by mouth at bedtime. 30 tablet 0   PTA Medications: (Not in a hospital admission)   Musculoskeletal: Strength & Muscle Tone: within normal limits Gait & Station: normal Patient leans: N/A  Psychiatric Specialty Exam:  Unable to complete MSE; patient sleeping. There has been no change since last assessment Physical Exam  Review of Systems  Blood pressure (!) 152/88, pulse (!) 116, temperature 98.8 F (37.1 C), temperature source Oral, resp. rate 20, SpO2 97 %.There is no height or weight on file to calculate BMI.     Demographic Factors:  Male  Loss Factors: NA  Historical Factors: Impulsivity  Risk Reduction Factors:   Positive social support  Continued Clinical Symptoms:  Autism disorder, fetal alcohol syndrome, intellectual disability  Cognitive Features That Contribute To Risk:  Loss of executive function    Suicide Risk:  Minimal: No identifiable suicidal ideation.  Patients presenting with no risk factors but with morbid ruminations; may be classified as minimal risk based on the severity of the depressive symptoms    Plan Of  Care/Follow-up recommendations:  Activity:  As tolerated Diet:  Heart healthy  Disposition:  Patient psychiatrically cleared for discharge to [facility]  No evidence of imminent risk to self or others at present.   Patient does not meet criteria for psychiatric inpatient admission. Supportive therapy provided about ongoing stressors. Discussed crisis plan, support from social network, calling 911, coming to the Emergency Department, and calling Suicide Hotline.   Verdis Bassette, NP 02/16/2020, 1:39 PM

## 2020-02-16 NOTE — ED Notes (Signed)
Patient agitated this morning. EDP informed. Geodon 20mg  IM, Zyprexa and Ativan given. Will continue to monitor.

## 2020-02-16 NOTE — BH Assessment (Signed)
[facility] Assessment Progress Note  Pt's [facility], [name], reports that the [facility] group home has agreed to accept pt today, and that Adonis Huguenin, pt's guardian, will be picking him up between 14:00 and 15:00 today.  This being the case, Nelly Rout, has determined that this pt does not require psychiatric hospitalization at this time.  She has rescinded pt's IVC that she initiated on 07/12/2020.  Pt's nurse, Gabriel Earing, has been notified.  Danelle Earthly later presents at [facility] to pick pt up.  At 14:41 this Clinical research associate called [name] to inform her.  Call rolled to voice mail and I left a message.  Doylene Canning, MA Triage Specialist [phone]

## 2020-03-07 ENCOUNTER — Encounter (HOSPITAL_COMMUNITY): Payer: Self-pay | Admitting: Emergency Medicine

## 2020-03-07 ENCOUNTER — Emergency Department (HOSPITAL_COMMUNITY)
Admission: EM | Admit: 2020-03-07 | Discharge: 2020-03-08 | Disposition: A | Discharge status: Home / Self Care | Payer: Medicare Other | Attending: Emergency Medicine | Admitting: Emergency Medicine

## 2020-03-07 ENCOUNTER — Other Ambulatory Visit: Payer: Self-pay

## 2020-03-07 DIAGNOSIS — Z79899 Other long term (current) drug therapy: Secondary | ICD-10-CM | POA: Insufficient documentation

## 2020-03-07 DIAGNOSIS — Z20822 Contact with and (suspected) exposure to covid-19: Secondary | ICD-10-CM | POA: Insufficient documentation

## 2020-03-07 DIAGNOSIS — F79 Unspecified intellectual disabilities: Secondary | ICD-10-CM | POA: Diagnosis not present

## 2020-03-07 DIAGNOSIS — F840 Autistic disorder: Secondary | ICD-10-CM | POA: Diagnosis present

## 2020-03-07 DIAGNOSIS — R4689 Other symptoms and signs involving appearance and behavior: Secondary | ICD-10-CM

## 2020-03-07 DIAGNOSIS — E119 Type 2 diabetes mellitus without complications: Secondary | ICD-10-CM | POA: Diagnosis not present

## 2020-03-07 DIAGNOSIS — F319 Bipolar disorder, unspecified: Secondary | ICD-10-CM | POA: Insufficient documentation

## 2020-03-07 DIAGNOSIS — R454 Irritability and anger: Secondary | ICD-10-CM | POA: Diagnosis present

## 2020-03-07 DIAGNOSIS — I10 Essential (primary) hypertension: Secondary | ICD-10-CM | POA: Insufficient documentation

## 2020-03-07 DIAGNOSIS — R45850 Homicidal ideations: Secondary | ICD-10-CM | POA: Diagnosis not present

## 2020-03-07 DIAGNOSIS — Q860 Fetal alcohol syndrome (dysmorphic): Secondary | ICD-10-CM | POA: Diagnosis present

## 2020-03-07 LAB — CBC
HCT: 43.9 % (ref 39.0–52.0)
Hemoglobin: 14.1 g/dL (ref 13.0–17.0)
MCH: 26.3 pg (ref 26.0–34.0)
MCHC: 32.1 g/dL (ref 30.0–36.0)
MCV: 81.9 fL (ref 80.0–100.0)
Platelets: 198 10*3/uL (ref 150–400)
RBC: 5.36 MIL/uL (ref 4.22–5.81)
RDW: 14.2 % (ref 11.5–15.5)
WBC: 5.1 10*3/uL (ref 4.0–10.5)
nRBC: 0 % (ref 0.0–0.2)

## 2020-03-07 LAB — COMPREHENSIVE METABOLIC PANEL
ALT: 24 U/L (ref 0–44)
AST: 23 U/L (ref 15–41)
Albumin: 4.3 g/dL (ref 3.5–5.0)
Alkaline Phosphatase: 125 U/L (ref 38–126)
Anion gap: 8 (ref 5–15)
BUN: 16 mg/dL (ref 6–20)
CO2: 27 mmol/L (ref 22–32)
Calcium: 8.8 mg/dL — ABNORMAL LOW (ref 8.9–10.3)
Chloride: 104 mmol/L (ref 98–111)
Creatinine, Ser: 0.76 mg/dL (ref 0.61–1.24)
GFR calc Af Amer: >60 mL/min (ref >60)
GFR calc non Af Amer: >60 mL/min (ref >60)
Glucose, Bld: 90 mg/dL (ref 70–99)
Potassium: 3.8 mmol/L (ref 3.5–5.1)
Sodium: 139 mmol/L (ref 135–145)
Total Bilirubin: 0.5 mg/dL (ref 0.3–1.2)
Total Protein: 7.4 g/dL (ref 6.5–8.1)

## 2020-03-07 LAB — RAPID URINE DRUG SCREEN, HOSP PERFORMED
Amphetamines: NOT DETECTED
Barbiturates: NOT DETECTED
Benzodiazepines: POSITIVE — AB
Cocaine: NOT DETECTED
Opiates: NOT DETECTED
Tetrahydrocannabinol: NOT DETECTED
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
## 2020-03-08 ENCOUNTER — Encounter (HOSPITAL_COMMUNITY): Payer: Self-pay | Admitting: Registered Nurse

## 2020-03-08 DIAGNOSIS — F79 Unspecified intellectual disabilities: Secondary | ICD-10-CM

## 2020-03-08 DIAGNOSIS — R4689 Other symptoms and signs involving appearance and behavior: Secondary | ICD-10-CM | POA: Diagnosis not present

## 2020-03-08 DIAGNOSIS — F840 Autistic disorder: Secondary | ICD-10-CM

## 2020-03-08 LAB — URINALYSIS, ROUTINE W REFLEX MICROSCOPIC
Bacteria, UA: NONE SEEN
Bilirubin Urine: NEGATIVE
Glucose, UA: NEGATIVE mg/dL
Hgb urine dipstick: NEGATIVE
Ketones, ur: NEGATIVE mg/dL
Leukocytes,Ua: NEGATIVE
Nitrite: NEGATIVE
Protein, ur: 30 mg/dL — AB
Specific Gravity, Urine: 1.03 (ref 1.005–1.030)
pH: 6 (ref 5.0–8.0)

## 2020-03-08 LAB — RESPIRATORY PANEL BY RT PCR (FLU A&B, COVID)
Influenza A by PCR: NEGATIVE
Influenza B by PCR: NEGATIVE
SARS Coronavirus 2 by RT PCR: NEGATIVE

## 2020-03-08 LAB — ETHANOL: Alcohol, Ethyl (B): <10 mg/dL (ref <10)

## 2020-03-08 LAB — ACETAMINOPHEN LEVEL: Acetaminophen (Tylenol), Serum: <10 ug/mL — ABNORMAL LOW (ref 10–30)

## 2020-03-08 LAB — SALICYLATE LEVEL: Salicylate Lvl: <7 mg/dL — ABNORMAL LOW (ref 7.0–30.0)

## 2020-03-08 MED ORDER — CHLORPROMAZINE HCL 25 MG PO TABS
100.0000 mg | ORAL_TABLET | Freq: Three times a day (TID) | ORAL | Status: DC
  Administered 2020-03-08 (×2): 100 mg via ORAL
  Filled 2020-03-08 (×3): qty 4

## 2020-03-08 MED ORDER — CARBAMAZEPINE 200 MG PO TABS
200.0000 mg | ORAL_TABLET | Freq: Two times a day (BID) | ORAL | Status: DC
  Administered 2020-03-08 (×2): 200 mg via ORAL
  Filled 2020-03-08 (×2): qty 1

## 2020-03-08 MED ORDER — LORATADINE 10 MG PO TABS
10.0000 mg | ORAL_TABLET | Freq: Every day | ORAL | Status: DC
  Administered 2020-03-08: 09:00:00 10 mg via ORAL
  Filled 2020-03-08 (×2): qty 1

## 2020-03-08 MED ORDER — AMANTADINE HCL 100 MG PO CAPS
100.0000 mg | ORAL_CAPSULE | Freq: Two times a day (BID) | ORAL | Status: DC
  Administered 2020-03-08 (×2): 100 mg via ORAL
  Filled 2020-03-08 (×2): qty 1

## 2020-03-08 MED ORDER — LORAZEPAM 1 MG PO TABS
1.5000 mg | ORAL_TABLET | Freq: Three times a day (TID) | ORAL | Status: DC
  Administered 2020-03-08 (×2): 1.5 mg via ORAL
  Filled 2020-03-08 (×2): qty 1

## 2020-03-08 MED ORDER — LORAZEPAM 0.5 MG PO TABS: 1.5000 mg | ORAL_TABLET | Freq: Three times a day (TID) | ORAL | 0 refills | Status: AC

## 2020-03-08 MED ORDER — MIRTAZAPINE 7.5 MG PO TABS
45.0000 mg | ORAL_TABLET | Freq: Every day | ORAL | Status: DC
  Administered 2020-03-08: 45 mg via ORAL
  Filled 2020-03-08: qty 1

## 2020-03-08 NOTE — ED Notes (Signed)
Pt discharged to his group home. He has been calm and cooperative during this visit.  All belongings returned to pt.
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
## 2020-03-08 NOTE — ED Notes (Signed)
Patient's belongings placed into belongings bag.

## 2020-03-08 NOTE — ED Notes (Signed)
Pt is very pleasant.  Telling everyone "good job" and giving a thumbs up.  Pt was ambulated to restroom and back to room w/o assistance.

## 2020-03-08 NOTE — Progress Notes (Signed)
Seeing pt was discharged, CSW retrieved pt's form to rescind pt's IVC and placed it in the basket to be added to pt's chart.    CSW then made a copy and placed it back on disposition's desk as requested.  Please reconsult if future social work needs arise.  CSW signing off, as social work intervention is no longer needed.  Dorothe Pea. Alic Hilburn  MSW, LCSW, LCAS, CSI Transitions of Care Clinical Social Worker Care Coordination Department Ph: [phone]
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
## 2020-03-08 NOTE — Consult Note (Signed)
[facility] Psych ED Discharge  03/08/2020 11:38 AM Jonathan [facility]  MRN:  [id number] Principal Problem: Autism disorder Discharge Diagnoses: Principal Problem:   Autism disorder Active Problems:   Aggression   Fetal alcohol syndrome   Intellectual disability   Outbursts of anger   Subjective: "Frosty the snowman.  December"  Assessment: [facility], 29 y.o., male patient seen via tele psych by this provider, Dr. Lucianne Muss; and chart reviewed on 03/08/20.  On evaluation Jonathan Bird is sitting on side of bed; he is in pleasant mood; stating that he had a good breakfast.  Patient denies suicidal/homicidal ideation.  States that he has been good.  States yes to wanting to Bird back to the group home and that he will be good.  Patient has had no behavior outburst since being in hospital.    During evaluation Jonathan [facility] is alert/oriented x self/place; calm/cooperative; and mood is congruent with affect.  He does not appear to be responding to internal/external stimuli or delusional thoughts.  Patient denies suicidal/self-harm/homicidal ideation, psychosis, and paranoia.  Patient is at hie baseline.  Patient is use to doing things in specific order; when out of order he gets up set; for example in morning he likes to have his shower and then breakfast when done out of order patient easily upset; patient move to group home is new environment and has been to the hospital multiple times related to aggression and may be related to group home needing assistance in establishing a routine for patient.  Patient with one guardian for 21 yrs with no hospital visit; sure prior guardian would be glad to work with group home.  Patient psychiatrically cleared.     Total Time spent with patient: 30 minutes  Past Psychiatric History: See above   Past Medical History:  Past Medical History:  Diagnosis Date  . Autism    outbursts  . Autism spectrum   . Bipolar disorder (HCC)   . Diabetes mellitus without  complication (HCC)    boarderline  . Hypertension     Past Surgical History:  Procedure Laterality Date  . NO PAST SURGERIES    . TOOTH EXTRACTION N/A 05/25/2014   Procedure: EXTRACTION WISDOM TEETH, Extraction teeth # 1, 16, 17, 32.;  Surgeon: Georgia Lopes, DDS;  Location: [facility] OR;  Service: Oral Surgery;  Laterality: N/A;   Family History: History reviewed. No pertinent family history. Family Psychiatric  History: Unaware Social History:  Social History   Substance and Sexual Activity  Alcohol Use No     Social History   Substance and Sexual Activity  Drug Use No    Social History   Socioeconomic History  . Marital status: Single    Spouse name: Not on file  . Number of children: Not on file  . Years of education: Not on file  . Highest education level: Not on file  Occupational History  . Not on file  Tobacco Use  . Smoking status: Never Smoker  . Smokeless tobacco: Never Used  Substance and Sexual Activity  . Alcohol use: No  . Drug use: No  . Sexual activity: Never  Other Topics Concern  . Not on file  Social History Narrative  . Not on file   Social Determinants of Health   Financial Resource Strain:   . Difficulty of Paying Living Expenses:   Food Insecurity:   . Worried About Programme researcher, broadcasting/film/video in the Last Year:   . [employer] of  Food in the Last Year:   Transportation Needs:   . Film/video editor (Medical):   Marland Kitchen Lack of Transportation (Non-Medical):   Physical Activity:   . Days of Exercise per Week:   . Minutes of Exercise per Session:   Stress:   . Feeling of Stress :   Social Connections:   . Frequency of Communication with Friends and Family:   . Frequency of Social Gatherings with Friends and Family:   . Attends Religious Services:   . Active Member of Clubs or Organizations:   . Attends Archivist Meetings:   Marland Kitchen Marital Status:     Has this patient used any form of tobacco in the last 30 days? (Cigarettes, Smokeless  Tobacco, Cigars, and/or Pipes) Prescription not provided because: does not use tobacco products  Current Medications: Current Facility-Administered Medications  Medication Dose Route Frequency Provider Last Rate Last Admin  . amantadine (SYMMETREL) capsule 100 mg  100 mg Oral BID Muthersbaugh, Jarrett Soho, PA-C   100 mg at 03/08/20 0919  . carbamazepine (TEGRETOL) tablet 200 mg  200 mg Oral BID Muthersbaugh, Hannah, PA-C   200 mg at 03/08/20 0919  . chlorproMAZINE (THORAZINE) tablet 100 mg  100 mg Oral TID Abigail Butts, PA-C   100 mg at 03/08/20 0918  . loratadine (CLARITIN) tablet 10 mg  10 mg Oral Daily Muthersbaugh, Jarrett Soho, PA-C   10 mg at 03/08/20 0919  . LORazepam (ATIVAN) tablet 1.5 mg  1.5 mg Oral TID Muthersbaugh, Jarrett Soho, PA-C   1.5 mg at 03/08/20 0919  . mirtazapine (REMERON) tablet 45 mg  45 mg Oral QHS Muthersbaugh, Hannah, PA-C   45 mg at 03/08/20 0303   Current Outpatient Medications  Medication Sig Dispense Refill  . amantadine (SYMMETREL) 100 MG capsule Take 1 capsule (100 mg total) by mouth 2 (two) times daily. 60 capsule 0  . carbamazepine (TEGRETOL) 200 MG tablet Take 1 tablet (200 mg total) by mouth 2 (two) times daily. 60 tablet 0  . cetirizine (ZYRTEC) 10 MG tablet Take 1 tablet (10 mg total) by mouth daily. 30 tablet 0  . chlorproMAZINE (THORAZINE) 100 MG tablet Take 1 tablet (100 mg total) by mouth 3 (three) times daily. 90 tablet 0  . hydrOXYzine (ATARAX/VISTARIL) 25 MG tablet Take 25 mg by mouth every 8 (eight) hours as needed for anxiety.     . mirtazapine (REMERON) 45 MG tablet Take 1 tablet (45 mg total) by mouth at bedtime. (Patient taking differently: Take 30 mg by mouth at bedtime. ) 30 tablet 0  . LORazepam (ATIVAN) 0.5 MG tablet Take 3 tablets (1.5 mg total) by mouth 3 (three) times daily. 30 tablet 0   PTA Medications: (Not in a hospital admission)   Musculoskeletal: Strength & Muscle Tone: within normal limits Gait & Station: normal Patient leans:  N/A  Psychiatric Specialty Exam: Physical Exam Pulmonary:     Effort: Pulmonary effort is normal.  Neurological:     Mental Status: He is alert.  Psychiatric:        Attention and Perception: He does not perceive auditory or visual hallucinations.        Mood and Affect: Mood normal.        Behavior: Behavior is cooperative.        Thought Content: Thought content is not paranoid or delusional. Thought content does not include homicidal or suicidal ideation.        Judgment: Judgment is impulsive.     Comments: Patient speech difficult to  understand related to an articulation problem.  Patient at baseline for behavior.       Review of Systems  Blood pressure 114/70, pulse 85, temperature 98.6 F (37 C), temperature source Oral, resp. rate 16, height 5\' 10"  (1.778 m), weight 104 kg, SpO2 98 %.Body mass index is 32.9 kg/m.  General Appearance: Casual  Eye Contact:  Good  Speech:  Diffiuclt to understand  Volume:  Normal  Mood:  Pleasant; appropriate  Affect:  Appropriate and Congruent  Thought Process:  Coherent and Linear  Orientation:  Other:  Oriented to person and place  Thought Content:  WDL patient at baseline  Suicidal Thoughts:  No  Homicidal Thoughts:  No  Memory:  Immediate;   Poor Recent;   Poor Remote;   Poor  Judgement:  Poor  Insight:  Present  Psychomotor Activity:  Normal  Concentration:  Concentration: Poor and Attention Span: Poor  Recall:  Poor  Fund of Knowledge:  Poor  Language:  Poor  Akathisia:  No  Handed:  Right  AIMS (if indicated):     Assets:  Desire for Improvement Housing Social Support  ADL's:  Intact  Cognition:  Impaired,  Severe  Sleep:        Demographic Factors:  Male  Loss Factors: NA  Historical Factors: Impulsivity  Risk Reduction Factors:   Living with another person, especially a relative and Positive social support  Continued Clinical Symptoms:  Autism, IDDM  Cognitive Features That Contribute To Risk:  Loss  of executive function    Suicide Risk:  Minimal: No identifiable suicidal ideation.  Patients presenting with no risk factors but with morbid ruminations; may be classified as minimal risk based on the severity of the depressive symptoms    Plan Of Care/Follow-up recommendations:  Activity:  As tolerated Diet:  Heart healthy   Follow up with current psychiatric outpatient provider  Disposition:  Patient psychiatrically cleared No evidence of imminent risk to self or others at present.   Patient does not meet criteria for psychiatric inpatient admission. Supportive therapy provided about ongoing stressors. Discussed crisis plan, support from social network, calling 911, coming to the Emergency Department, and calling Suicide Hotline.   Armya Westerhoff, NP 03/08/2020, 11:38 AM

## 2020-03-08 NOTE — ED Provider Notes (Signed)
The patient has been placed in psychiatric observation due to the need to provide a safe environment for the patient while obtaining psychiatric consultation and evaluation, as well as ongoing medical and medication management to treat the patient's condition.  The patient has been placed under full IVC at this time.   Lorre Nick, MD 03/08/20 [phone]

## 2020-03-08 NOTE — BH Assessment (Signed)
Clinician attempted to make contact w/ the following people for pt:  Sander Radon, Legal Guardian w/ [facility] DSS: [phone] Clinician called the number in pt's chart at [phone] but the phone number was a general number for social workers, not specifically for pt's legal guardian, and clinician did not feel it would provide enough confidentiality or meet HIPAA guidelines to leave a message re: pt, so no message was left.  Juanangel Soderholm, "Other": [phone] This contact was listed in pt's chart as "other;" clinician called this number at [phone] but no one picked up and, after several rings, a recording came on stating the call did not go through and to try the call again at a later time.  Forde Radon, Qualified Professional: [phone] This name and number was obtained off of pt's IVC paperwork; clinician attempted to make contact w/ this key player at [phone] but was unsuccessful. Clinician left a HIPAA-compliant voicemail message requesting clinician's phone call be returned at this person's earliest convenience.

## 2020-03-09 NOTE — BH Assessment (Signed)
BHH Assessment Progress Note  Pt presents under IVC initiated by a Herbalist and upheld by EDP Glynn Octave, MD, which has been rescinded by Nelly Rout, MD.  Doylene Canning, MA Behavioral Health Coordinator [phone]

## 2020-03-18 ENCOUNTER — Emergency Department (HOSPITAL_COMMUNITY)
Admission: EM | Admit: 2020-03-18 | Discharge: 2020-03-19 | Disposition: A | Discharge status: Home / Self Care | Payer: Medicare Other | Attending: Emergency Medicine | Admitting: Emergency Medicine

## 2020-03-18 ENCOUNTER — Other Ambulatory Visit: Payer: Self-pay

## 2020-03-18 DIAGNOSIS — Z20822 Contact with and (suspected) exposure to covid-19: Secondary | ICD-10-CM | POA: Insufficient documentation

## 2020-03-18 DIAGNOSIS — Z79899 Other long term (current) drug therapy: Secondary | ICD-10-CM | POA: Diagnosis not present

## 2020-03-18 DIAGNOSIS — R479 Unspecified speech disturbances: Secondary | ICD-10-CM | POA: Insufficient documentation

## 2020-03-18 DIAGNOSIS — F319 Bipolar disorder, unspecified: Secondary | ICD-10-CM | POA: Diagnosis not present

## 2020-03-18 DIAGNOSIS — E119 Type 2 diabetes mellitus without complications: Secondary | ICD-10-CM | POA: Insufficient documentation

## 2020-03-18 DIAGNOSIS — R451 Restlessness and agitation: Secondary | ICD-10-CM | POA: Insufficient documentation

## 2020-03-18 DIAGNOSIS — Z046 Encounter for general psychiatric examination, requested by authority: Secondary | ICD-10-CM | POA: Diagnosis present

## 2020-03-18 DIAGNOSIS — I10 Essential (primary) hypertension: Secondary | ICD-10-CM | POA: Diagnosis not present

## 2020-03-18 DIAGNOSIS — F840 Autistic disorder: Secondary | ICD-10-CM | POA: Insufficient documentation
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
## 2020-03-19 DIAGNOSIS — R451 Restlessness and agitation: Secondary | ICD-10-CM | POA: Diagnosis not present

## 2020-03-19 LAB — RAPID URINE DRUG SCREEN, HOSP PERFORMED
Amphetamines: NOT DETECTED
Barbiturates: NOT DETECTED
Benzodiazepines: POSITIVE — AB
Cocaine: NOT DETECTED
Opiates: NOT DETECTED
Tetrahydrocannabinol: NOT DETECTED

## 2020-03-19 LAB — COMPREHENSIVE METABOLIC PANEL
ALT: 21 U/L (ref 0–44)
AST: 26 U/L (ref 15–41)
Albumin: 4.5 g/dL (ref 3.5–5.0)
Alkaline Phosphatase: 132 U/L — ABNORMAL HIGH (ref 38–126)
Anion gap: 11 (ref 5–15)
BUN: 11 mg/dL (ref 6–20)
CO2: 26 mmol/L (ref 22–32)
Calcium: 9.2 mg/dL (ref 8.9–10.3)
Chloride: 102 mmol/L (ref 98–111)
Creatinine, Ser: 0.94 mg/dL (ref 0.61–1.24)
GFR calc Af Amer: >60 mL/min (ref >60)
GFR calc non Af Amer: >60 mL/min (ref >60)
Glucose, Bld: 104 mg/dL — ABNORMAL HIGH (ref 70–99)
Potassium: 3.9 mmol/L (ref 3.5–5.1)
Sodium: 139 mmol/L (ref 135–145)
Total Bilirubin: 0.2 mg/dL — ABNORMAL LOW (ref 0.3–1.2)
Total Protein: 7.4 g/dL (ref 6.5–8.1)

## 2020-03-19 LAB — RESPIRATORY PANEL BY RT PCR (FLU A&B, COVID)
Influenza A by PCR: NEGATIVE
Influenza B by PCR: NEGATIVE
SARS Coronavirus 2 by RT PCR: NEGATIVE

## 2020-03-19 LAB — CBC
HCT: 47 % (ref 39.0–52.0)
Hemoglobin: 15.2 g/dL (ref 13.0–17.0)
MCH: 26 pg (ref 26.0–34.0)
MCHC: 32.3 g/dL (ref 30.0–36.0)
MCV: 80.5 fL (ref 80.0–100.0)
Platelets: 197 10*3/uL (ref 150–400)
RBC: 5.84 MIL/uL — ABNORMAL HIGH (ref 4.22–5.81)
RDW: 13.9 % (ref 11.5–15.5)
WBC: 4.4 10*3/uL (ref 4.0–10.5)
nRBC: 0 % (ref 0.0–0.2)

## 2020-03-19 LAB — ACETAMINOPHEN LEVEL: Acetaminophen (Tylenol), Serum: <10 ug/mL — ABNORMAL LOW (ref 10–30)

## 2020-03-19 LAB — ETHANOL: Alcohol, Ethyl (B): <10 mg/dL (ref <10)

## 2020-03-19 LAB — SALICYLATE LEVEL: Salicylate Lvl: <7 mg/dL — ABNORMAL LOW (ref 7.0–30.0)

## 2020-03-19 MED ORDER — HYDROXYZINE HCL 25 MG PO TABS
25.0000 mg | ORAL_TABLET | Freq: Three times a day (TID) | ORAL | Status: DC | PRN
  Administered 2020-03-19: 25 mg via ORAL
  Filled 2020-03-19: qty 1

## 2020-03-19 MED ORDER — AMANTADINE HCL 100 MG PO CAPS
100.0000 mg | ORAL_CAPSULE | Freq: Two times a day (BID) | ORAL | Status: DC
  Administered 2020-03-19 (×2): 100 mg via ORAL
  Filled 2020-03-19 (×4): qty 1

## 2020-03-19 MED ORDER — LORAZEPAM 1 MG PO TABS: 1.0000 mg | ORAL_TABLET | ORAL | Status: DC | PRN

## 2020-03-19 MED ORDER — MIRTAZAPINE 15 MG PO TABS
30.0000 mg | ORAL_TABLET | Freq: Every day | ORAL | Status: DC
  Administered 2020-03-19: 30 mg via ORAL
  Filled 2020-03-19: qty 1

## 2020-03-19 MED ORDER — CHLORPROMAZINE HCL 25 MG PO TABS
100.0000 mg | ORAL_TABLET | Freq: Three times a day (TID) | ORAL | Status: DC
  Administered 2020-03-19: 100 mg via ORAL
  Filled 2020-03-19: qty 4

## 2020-03-19 MED ORDER — CARBAMAZEPINE 200 MG PO TABS
200.0000 mg | ORAL_TABLET | Freq: Two times a day (BID) | ORAL | Status: DC
  Administered 2020-03-19 (×2): 200 mg via ORAL
  Filled 2020-03-19 (×2): qty 1

## 2020-03-19 MED ORDER — RISPERIDONE 2 MG PO TBDP
2.0000 mg | ORAL_TABLET | Freq: Three times a day (TID) | ORAL | Status: DC | PRN
  Administered 2020-03-19: 2 mg via ORAL
  Filled 2020-03-19: qty 1

## 2020-03-19 MED ORDER — ZIPRASIDONE MESYLATE 20 MG IM SOLR
20.0000 mg | INTRAMUSCULAR | Status: AC | PRN
  Administered 2020-03-19: 20 mg via INTRAMUSCULAR
  Filled 2020-03-19: qty 20

## 2020-03-19 NOTE — BH Assessment (Signed)
BHH Assessment Progress Note  Starkes FNP evaluated patient and recommended patient will be discharged later this date back to group home. CSW to coordinate transportation.
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
## 2020-03-19 NOTE — ED Notes (Signed)
Pt became aggressive with this RN and the home coordinator who is with the patient calmed the patient down. Risperidone PRN was administered. Pt is now in purple scrubs and belongings inventoried. Visitor signed for the patient. Belongings in locker 2.

## 2020-03-19 NOTE — BHH Counselor (Signed)
TTS received phone call from Blair Dolphin [phone], from patient's facility. She states Trena Platt, from the facility, is en route to pick up patient.

## 2020-03-19 NOTE — ED Notes (Signed)
Breakfast Ordered 

## 2020-03-19 NOTE — Progress Notes (Signed)
CSW left voice message with Sander Radon, pt's legal guardian, notifying her of pt's disposition.   CSW was unable to leave a message with group home staff. Will continue to attempt to reach out.   Wells Guiles, LCSW, LCAS Disposition CSW [facility] BHH/TTS [phone]
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
## 2020-03-19 NOTE — Progress Notes (Signed)
CSW spoke with group home QP, Forde Radon, [phone]). He is out of town but will attempt to contact group home administer Corrie Brannen. Mr. Lyda Jester did state that pt has ran out of Lorazepam. His dose was reportedly increased while he was at [facility] last week, and was given a three day supply, but he does not visit with his psychiatrist until 4/13. He asks if out NP can assistance with this. Mr Lyda Jester also reports that pt's pharmacy should be changed to [facility] in [place], [place].   [facility] NP will be notified.   Wells Guiles, LCSW, LCAS Disposition CSW [facility] BHH/TTS [phone]
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
## 2020-03-19 NOTE — ED Notes (Signed)
Pt eating bfast and listening to music. Pt took morning meds.

## 2020-03-20 ENCOUNTER — Ambulatory Visit: Payer: Medicare Other

## 2020-05-02 ENCOUNTER — Encounter (HOSPITAL_COMMUNITY): Payer: Self-pay | Admitting: Emergency Medicine

## 2020-05-02 ENCOUNTER — Emergency Department (HOSPITAL_COMMUNITY)
Admission: EM | Admit: 2020-05-02 | Discharge: 2020-05-02 | Disposition: A | Discharge status: Home / Self Care | Payer: Medicare Other | Attending: Emergency Medicine | Admitting: Emergency Medicine

## 2020-05-02 ENCOUNTER — Emergency Department (HOSPITAL_COMMUNITY)
Admission: EM | Admit: 2020-05-02 | Discharge: 2020-05-02 | Disposition: A | Discharge status: Home / Self Care | Payer: Medicare Other | Source: Home / Self Care | Attending: Emergency Medicine | Admitting: Emergency Medicine

## 2020-05-02 ENCOUNTER — Other Ambulatory Visit: Payer: Self-pay

## 2020-05-02 DIAGNOSIS — Y92199 Unspecified place in other specified residential institution as the place of occurrence of the external cause: Secondary | ICD-10-CM | POA: Insufficient documentation

## 2020-05-02 DIAGNOSIS — Y999 Unspecified external cause status: Secondary | ICD-10-CM | POA: Insufficient documentation

## 2020-05-02 DIAGNOSIS — W01198D Fall on same level from slipping, tripping and stumbling with subsequent striking against other object, subsequent encounter: Secondary | ICD-10-CM | POA: Diagnosis not present

## 2020-05-02 DIAGNOSIS — Y939 Activity, unspecified: Secondary | ICD-10-CM | POA: Diagnosis not present

## 2020-05-02 DIAGNOSIS — F319 Bipolar disorder, unspecified: Secondary | ICD-10-CM | POA: Insufficient documentation

## 2020-05-02 DIAGNOSIS — S0101XS Laceration without foreign body of scalp, sequela: Secondary | ICD-10-CM | POA: Insufficient documentation

## 2020-05-02 DIAGNOSIS — I10 Essential (primary) hypertension: Secondary | ICD-10-CM | POA: Diagnosis not present

## 2020-05-02 DIAGNOSIS — S0101XA Laceration without foreign body of scalp, initial encounter: Secondary | ICD-10-CM | POA: Insufficient documentation

## 2020-05-02 DIAGNOSIS — Z79899 Other long term (current) drug therapy: Secondary | ICD-10-CM | POA: Diagnosis not present

## 2020-05-02 DIAGNOSIS — E119 Type 2 diabetes mellitus without complications: Secondary | ICD-10-CM | POA: Diagnosis not present

## 2020-05-02 DIAGNOSIS — F840 Autistic disorder: Secondary | ICD-10-CM | POA: Insufficient documentation

## 2020-05-02 DIAGNOSIS — Z5189 Encounter for other specified aftercare: Secondary | ICD-10-CM | POA: Insufficient documentation

## 2020-05-02 DIAGNOSIS — S0101XD Laceration without foreign body of scalp, subsequent encounter: Secondary | ICD-10-CM

## 2020-05-02 DIAGNOSIS — W2209XA Striking against other stationary object, initial encounter: Secondary | ICD-10-CM | POA: Diagnosis not present

## 2020-05-02 DIAGNOSIS — W19XXXA Unspecified fall, initial encounter: Secondary | ICD-10-CM

## 2020-05-02 MED ORDER — LORAZEPAM 1 MG PO TABS
2.0000 mg | ORAL_TABLET | Freq: Once | ORAL | Status: AC
  Administered 2020-05-02: 2 mg via ORAL
  Filled 2020-05-02: qty 2

## 2020-05-02 MED ORDER — HYDROXYZINE HCL 25 MG PO TABS: 25.0000 mg | ORAL_TABLET | Freq: Every day | ORAL | 0 refills | Status: DC

## 2020-05-02 MED ORDER — LIDOCAINE HCL (PF) 1 % IJ SOLN
30.0000 mL | Freq: Once | INTRAMUSCULAR | Status: AC
  Administered 2020-05-02: 30 mL
  Filled 2020-05-02: qty 30

## 2020-05-02 NOTE — ED Triage Notes (Signed)
Pt arrives via EMS from Group Home with reports of hitting back of head on end of table. Head lac to back of head. Hx of autism. 129/80, CBG 129, 98% RA

## 2020-05-02 NOTE — Discharge Instructions (Signed)
Please return to the ER in 8 days for staple removal plate.  Please monitor for signs of infection which includes drainage, increased redness, increased swelling.

## 2020-05-02 NOTE — ED Triage Notes (Signed)
Pt seen in ED earlier today for head lac to back of head.  He removed the staples when he returned to the group home.

## 2020-05-02 NOTE — ED Notes (Signed)
Pt screaming and acting out  Security at  The bedside  The gentleman with the pt is attempting to  Calm the pt buit he is having difficulty .

## 2020-05-02 NOTE — Discharge Instructions (Signed)
Return if any problems. Staple removal in 8 days

## 2020-05-02 NOTE — ED Provider Notes (Signed)
[facility] EMERGENCY DEPARTMENT Provider Note   CSN: [id number] Arrival date & time: 05/02/20  1652     History Chief Complaint  Patient presents with  . Head Laceration    Jonathan Bird is a 29 y.o. male.  HPI 29 year old male with a history of attention disorder, aggressive behavior, fever, constipation, intellectual disability presents to the ER for scalp laceration.  Patient was seen earlier in the ER today where 7 staples were placed in his forehead.  Father at bedside states that after he returned to the group home, he pulled out all of his staples.  He reports that the laceration has now grown.  There has been no significant bleeding however.  Patient's father reports that he was having a aggressive outburst at his group home, and fell backwards and hit his head during de-escalation.  He is asking if his scalp could possibly be sutured.    Past Medical History:  Diagnosis Date  . Autism    outbursts  . Autism spectrum   . Bipolar disorder (HCC)   . Diabetes mellitus without complication (HCC)    boarderline  . Hypertension     Patient Active Problem List   Diagnosis Date Noted  . 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 Intellectual disability 02/04/2020  . Outbursts of anger   . Autism disorder 01/22/2020  . Aggressive behavior 01/22/2020    Past Surgical History:  Procedure Laterality Date  . NO PAST SURGERIES    . TOOTH EXTRACTION N/A 05/25/2014   Procedure: EXTRACTION WISDOM TEETH, Extraction teeth # 1, 16, 17, 32.;  Surgeon: Georgia Lopes, DDS;  Location: [facility] OR;  Service: Oral Surgery;  Laterality: N/A;       No family history on file.  Social History   Tobacco Use  . Smoking status: Never Smoker  . Smokeless tobacco: Never Used  Substance Use Topics  . Alcohol use: No  . Drug use: No    Home Medications Prior to Admission medications   Medication Sig Start Date End Date Taking? Authorizing Provider  amantadine (SYMMETREL)  100 MG capsule Take 1 capsule (100 mg total) by mouth 2 (two) times daily. 02/16/20   Bethann Berkshire, MD  carbamazepine (TEGRETOL) 200 MG tablet Take 1 tablet (200 mg total) by mouth 2 (two) times daily. 02/16/20   Bethann Berkshire, MD  cetirizine (ZYRTEC) 10 MG tablet Take 1 tablet (10 mg total) by mouth daily. 02/16/20   Bethann Berkshire, MD  chlorproMAZINE (THORAZINE) 100 MG tablet Take 1 tablet (100 mg total) by mouth 3 (three) times daily. Patient taking differently: Take 100-200 mg by mouth See admin instructions. Take 100 mg by mouth in the morning and 200 mg at bedtime 02/16/20   Bethann Berkshire, MD  hydrOXYzine (ATARAX/VISTARIL) 25 MG tablet Take 1 tablet (25 mg total) by mouth at bedtime. 05/02/20   Elson Areas, PA-C  LORazepam (ATIVAN) 0.5 MG tablet Take 3 tablets (1.5 mg total) by mouth 3 (three) times daily. 03/08/20   Rankin, Shuvon B, NP  mirtazapine (REMERON) 30 MG tablet Take 30 mg by mouth at bedtime.    [provider]    Allergies    Patient has no known allergies.  Review of Systems   Review of Systems  Skin: Positive for wound.    Physical Exam Updated Vital Signs BP (!) 147/89   Pulse (!) 105   Temp 98.4 F (36.9 C) (Oral)   Resp 18   Ht 5\' 10"  (1.778  m)   Wt 87.1 kg   SpO2 100%   BMI 27.55 kg/m   Physical Exam Vitals and nursing note reviewed.  Constitutional:      Appearance: He is well-developed.  HENT:     Head: Normocephalic and atraumatic.  Eyes:     Conjunctiva/sclera: Conjunctivae normal.  Cardiovascular:     Rate and Rhythm: Normal rate and regular rhythm.     Heart sounds: No murmur.  Pulmonary:     Effort: Pulmonary effort is normal. No respiratory distress.     Breath sounds: Normal breath sounds.  Abdominal:     Palpations: Abdomen is soft.     Tenderness: There is no abdominal tenderness.  Musculoskeletal:     Cervical back: Neck supple.  Skin:    General: Skin is warm and dry.     Findings: Lesion present.     Comments: 7 cm and  1 mm deep laceration to the left submental area.  No active bleeding.  Neurological:     Mental Status: He is alert.     ED Results / Procedures / Treatments   Labs (all labs ordered are listed, but only abnormal results are displayed) Labs Reviewed - No data to display  EKG None  Radiology No results found.  Procedures .Marland KitchenLaceration Repair  Date/Time: 05/02/2020 7:46 PM Performed by: Mare Ferrari, PA-C Authorized by: Mare Ferrari, PA-C   Consent:    Consent obtained:  Verbal   Consent given by:  Patient   Risks discussed:  Infection, need for additional repair, pain, poor cosmetic result and poor wound healing   Alternatives discussed:  No treatment and delayed treatment Universal protocol:    Procedure explained and questions answered to patient or proxy's satisfaction: yes     Relevant documents present and verified: yes     Test results available and properly labeled: yes     Imaging studies available: yes     Required blood products, implants, devices, and special equipment available: yes     Site/side marked: yes     Immediately prior to procedure, a time out was called: yes     Patient identity confirmed:  Verbally with patient Anesthesia (see MAR for exact dosages):    Anesthesia method:  None Laceration details:    Location:  Scalp   Length (cm):  7   Depth (mm):  1 Repair type:    Repair type:  Simple Exploration:    Hemostasis achieved with:  Direct pressure   Wound exploration: wound explored through full range of motion     Wound extent: no foreign bodies/material noted, no muscle damage noted, no nerve damage noted and no underlying fracture noted     Contaminated: no   Treatment:    Area cleansed with:  Betadine and saline   Amount of cleaning:  Standard   Irrigation solution:  Sterile water   Irrigation volume:  20cc   Irrigation method:  Syringe   Visualized foreign bodies/material removed: no   Skin repair:    Repair method:  Staples    Number of staples:  5 Approximation:    Approximation:  Loose Post-procedure details:    Dressing:  Non-adherent dressing   Patient tolerance of procedure:  Tolerated with difficulty Comments:     Although patient was given Ativan prior to the procedure, patient still did not remain still and fought throughout the procedure even with presence of security guards.  I was able to administer 5 staples, though it  was difficult to properly approximate and center the staples.  He remained combative throughout the procedure and I thought it best to stop after administering 5 staples in order to avoid further escalation.   (including critical care time)  Medications Ordered in ED Medications  LORazepam (ATIVAN) tablet 2 mg (2 mg Oral Given 05/02/20 1838)    ED Course  I have reviewed the triage vital signs and the nursing notes.  Pertinent labs & imaging results that were available during my care of the patient were reviewed by me and considered in my medical decision making (see chart for details).    MDM Rules/Calculators/A&P                      29 year old male with a history of intellectual disability presents to the ER after he pulled out his staples from his scalp laceration earlier today. Patient is combative and easily agitated and I do not think that he would tolerate suturing.  I discussed this at length with his father, we agreed that no additional anesthesia should be administered since he already received lidocaine and stated that he did not tolerate administration well.  I informed him that her best bet would be just too quickly try to administer the staples without numbing.  Even with several security guards, 2 mg of Ativan prior to the procedure, the patient was still extremely combative throughout the procedure.  I was able to administer 5 stable to the best of my disabilities, though the approximation was difficult.  Procedure terminated after 5 staples.  I also wrapped his head  with non adhesive gauze to try to prevent him from pulling staples out.  Informed the patient's father that if he pulls these out again, this will likely just need to be left open and monitored closely for infection.  I informed him that he needs to return in 8 days for staple removal.  Father voices understanding and is agreeable to this plan.  At this stage in the ED course, the patient is stable for discharge. Final Clinical Impression(s) / ED Diagnoses Final diagnoses:  Laceration of scalp, subsequent encounter    Rx / DC Orders ED Discharge Orders    None       Lyndel Safe 05/02/20 1954    Elnora Morrison, MD 05/03/20 [phone]

## 2020-05-02 NOTE — ED Notes (Signed)
See pas notes the pt was seen by the pa before I saw.   Lac to the back of the pta head was sutured with staples  He is in a group home  He removed the staples himself.  Pt medicated  He took ativan with no difficulty

## 2020-05-02 NOTE — ED Provider Notes (Signed)
[place] EMERGENCY DEPARTMENT Provider Note   CSN: [id number] Arrival date & time: 05/02/20  1252     History Chief Complaint  Patient presents with  . Fall    Jonathan Bird is a 29 y.o. male.  The history is provided by the patient. No language interpreter was used.  Fall This is a new problem. The current episode started 1 to 2 hours ago. The problem occurs constantly. The problem has been gradually worsening. Pertinent negatives include no shortness of breath. Nothing aggravates the symptoms. Nothing relieves the symptoms. He has tried nothing for the symptoms. The treatment provided no relief.  Pt had a outburst at group home and hit his head.  No loss of consciousness.  Pt is acting normally      Past Medical History:  Diagnosis Date  . Autism    outbursts  . Autism spectrum   . Bipolar disorder ([place])   . Diabetes mellitus without complication ([place])    boarderline  . Hypertension     Patient Active Problem List   Diagnosis Date Noted  . 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 Intellectual disability 02/04/2020  . Outbursts of anger   . Autism disorder 01/22/2020  . Aggressive behavior 01/22/2020    Past Surgical History:  Procedure Laterality Date  . NO PAST SURGERIES    . TOOTH EXTRACTION N/A 05/25/2014   Procedure: EXTRACTION WISDOM TEETH, Extraction teeth # 1, 16, 17, 32.;  Surgeon: Gae Bon, DDS;  Location: [place];  Service: Oral Surgery;  Laterality: N/A;       No family history on file.  Social History   Tobacco Use  . Smoking status: Never Smoker  . Smokeless tobacco: Never Used  Substance Use Topics  . Alcohol use: No  . Drug use: No    Home Medications Prior to Admission medications   Medication Sig Start Date End Date Taking? Authorizing Provider  amantadine (SYMMETREL) 100 MG capsule Take 1 capsule (100 mg total) by mouth 2 (two) times daily. 02/16/20   Milton Ferguson, MD  carbamazepine (TEGRETOL) 200 MG tablet  Take 1 tablet (200 mg total) by mouth 2 (two) times daily. 02/16/20   Milton Ferguson, MD  cetirizine (ZYRTEC) 10 MG tablet Take 1 tablet (10 mg total) by mouth daily. 02/16/20   Milton Ferguson, MD  chlorproMAZINE (THORAZINE) 100 MG tablet Take 1 tablet (100 mg total) by mouth 3 (three) times daily. Patient taking differently: Take 100-200 mg by mouth See admin instructions. Take 100 mg by mouth in the morning and 200 mg at bedtime 02/16/20   Milton Ferguson, MD  hydrOXYzine (ATARAX/VISTARIL) 25 MG tablet Take 1 tablet (25 mg total) by mouth at bedtime. 05/02/20   Fransico Meadow, PA-C  LORazepam (ATIVAN) 0.5 MG tablet Take 3 tablets (1.5 mg total) by mouth 3 (three) times daily. 03/08/20   Rankin, Shuvon B, NP  mirtazapine (REMERON) 30 MG tablet Take 30 mg by mouth at bedtime.    [provider]    Allergies    Patient has no known allergies.  Review of Systems   Review of Systems  Respiratory: Negative for shortness of breath.   All other systems reviewed and are negative.   Physical Exam Updated Vital Signs BP (!) 144/104   Pulse (!) 109   Temp 99.5 F (37.5 C) (Oral)   Resp 19   SpO2 99%   Physical Exam Vitals and nursing note reviewed.  Constitutional:  Appearance: He is well-developed.  HENT:     Head: Normocephalic and atraumatic.     Comments: 6cm laceration occipital scalp,      Mouth/Throat:     Mouth: Mucous membranes are moist.  Eyes:     Conjunctiva/sclera: Conjunctivae normal.  Cardiovascular:     Rate and Rhythm: Normal rate.     Heart sounds: No murmur.  Pulmonary:     Effort: Pulmonary effort is normal. No respiratory distress.  Abdominal:     Tenderness: There is no abdominal tenderness.  Musculoskeletal:        General: Normal range of motion.     Cervical back: Neck supple.  Skin:    General: Skin is warm and dry.  Neurological:     General: No focal deficit present.     Mental Status: He is alert.  Psychiatric:        Mood and Affect: Mood  normal.     ED Results / Procedures / Treatments   Labs (all labs ordered are listed, but only abnormal results are displayed) Labs Reviewed - No data to display  EKG None  Radiology No results found.  Procedures .Marland KitchenLaceration Repair  Date/Time: 05/02/2020 2:30 PM Performed by: Elson Areas, PA-C Authorized by: Elson Areas, PA-C   Consent:    Consent obtained:  Verbal   Consent given by:  Patient   Risks discussed:  Infection, need for additional repair, pain, poor cosmetic result and poor wound healing   Alternatives discussed:  No treatment and delayed treatment Universal protocol:    Procedure explained and questions answered to patient or proxy's satisfaction: yes     Relevant documents present and verified: yes     Test results available and properly labeled: yes     Imaging studies available: yes     Required blood products, implants, devices, and special equipment available: yes     Site/side marked: yes     Immediately prior to procedure, a time out was called: yes     Patient identity confirmed:  Verbally with patient Anesthesia (see MAR for exact dosages):    Anesthesia method:  Local infiltration Laceration details:    Location:  Scalp   Scalp location:  Occipital   Length (cm):  6 Repair type:    Repair type:  Simple Pre-procedure details:    Preparation:  Patient was prepped and draped in usual sterile fashion Exploration:    Contaminated: no   Treatment:    Area cleansed with:  Betadine   Irrigation solution:  Sterile saline   Visualized foreign bodies/material removed: no   Skin repair:    Repair method:  Staples   Number of staples:  7 Post-procedure details:    Patient tolerance of procedure:  Tolerated well, no immediate complications   (including critical care time)  Medications Ordered in ED Medications  lidocaine (PF) (XYLOCAINE) 1 % injection 30 mL (30 mLs Infiltration Given 05/02/20 1327)    ED Course  I have reviewed the  triage vital signs and the nursing notes.  Pertinent labs & imaging results that were available during my care of the patient were reviewed by me and considered in my medical decision making (see chart for details).    MDM Rules/Calculators/A&P                      MDM: Staple removal in 8 days Group home member asked for medication to help pt relax.  Pt has had  visteril in the past.  Final Clinical Impression(s) / ED Diagnoses Final diagnoses:  Laceration of scalp, initial encounter  Fall, initial encounter    Rx / DC Orders ED Discharge Orders         Ordered    hydrOXYzine (ATARAX/VISTARIL) 25 MG tablet  Daily at bedtime     05/02/20 1322        An After Visit Summary was printed and given to the patient.    Elson Areas, [place] 05/02/20 1432    Geoffery Lyons, MD 05/02/20 [phone]

## 2020-06-13 ENCOUNTER — Other Ambulatory Visit: Payer: Self-pay

## 2020-06-13 ENCOUNTER — Encounter (HOSPITAL_COMMUNITY): Payer: Self-pay | Admitting: Emergency Medicine

## 2020-06-13 ENCOUNTER — Emergency Department (HOSPITAL_COMMUNITY)
Admission: EM | Admit: 2020-06-13 | Discharge: 2020-06-14 | Disposition: A | Discharge status: Home / Self Care | Payer: Medicare Other | Attending: Emergency Medicine | Admitting: Emergency Medicine

## 2020-06-13 DIAGNOSIS — Z20822 Contact with and (suspected) exposure to covid-19: Secondary | ICD-10-CM | POA: Insufficient documentation

## 2020-06-13 DIAGNOSIS — Z79899 Other long term (current) drug therapy: Secondary | ICD-10-CM | POA: Diagnosis not present

## 2020-06-13 DIAGNOSIS — Z7984 Long term (current) use of oral hypoglycemic drugs: Secondary | ICD-10-CM | POA: Diagnosis not present

## 2020-06-13 DIAGNOSIS — F840 Autistic disorder: Secondary | ICD-10-CM | POA: Diagnosis not present

## 2020-06-13 DIAGNOSIS — R4689 Other symptoms and signs involving appearance and behavior: Secondary | ICD-10-CM

## 2020-06-13 DIAGNOSIS — F319 Bipolar disorder, unspecified: Secondary | ICD-10-CM | POA: Diagnosis not present

## 2020-06-13 DIAGNOSIS — I10 Essential (primary) hypertension: Secondary | ICD-10-CM | POA: Insufficient documentation

## 2020-06-13 DIAGNOSIS — R451 Restlessness and agitation: Secondary | ICD-10-CM | POA: Insufficient documentation

## 2020-06-13 DIAGNOSIS — E119 Type 2 diabetes mellitus without complications: Secondary | ICD-10-CM | POA: Diagnosis not present

## 2020-06-13 LAB — COMPREHENSIVE METABOLIC PANEL
ALT: 24 U/L (ref 0–44)
AST: 32 U/L (ref 15–41)
Albumin: 4.6 g/dL (ref 3.5–5.0)
Alkaline Phosphatase: 144 U/L — ABNORMAL HIGH (ref 38–126)
Anion gap: 10 (ref 5–15)
BUN: 12 mg/dL (ref 6–20)
CO2: 24 mmol/L (ref 22–32)
Calcium: 9.1 mg/dL (ref 8.9–10.3)
Chloride: 102 mmol/L (ref 98–111)
Creatinine, Ser: 0.85 mg/dL (ref 0.61–1.24)
GFR calc Af Amer: >60 mL/min (ref >60)
GFR calc non Af Amer: >60 mL/min (ref >60)
Glucose, Bld: 116 mg/dL — ABNORMAL HIGH (ref 70–99)
Potassium: 3.5 mmol/L (ref 3.5–5.1)
Sodium: 136 mmol/L (ref 135–145)
Total Bilirubin: 0.3 mg/dL (ref 0.3–1.2)
Total Protein: 7.1 g/dL (ref 6.5–8.1)

## 2020-06-13 LAB — RAPID URINE DRUG SCREEN, HOSP PERFORMED
Amphetamines: NOT DETECTED
Barbiturates: NOT DETECTED
Benzodiazepines: POSITIVE — AB
Cocaine: NOT DETECTED
Opiates: NOT DETECTED
Tetrahydrocannabinol: NOT DETECTED

## 2020-06-13 LAB — ETHANOL: Alcohol, Ethyl (B): <10 mg/dL (ref <10)

## 2020-06-13 LAB — SARS CORONAVIRUS 2 BY RT PCR (HOSPITAL ORDER, PERFORMED IN ~~LOC~~ HOSPITAL LAB): SARS Coronavirus 2: NEGATIVE

## 2020-06-13 LAB — CBC
HCT: 45 % (ref 39.0–52.0)
Hemoglobin: 14.4 g/dL (ref 13.0–17.0)
MCH: 25.9 pg — ABNORMAL LOW (ref 26.0–34.0)
MCHC: 32 g/dL (ref 30.0–36.0)
MCV: 80.9 fL (ref 80.0–100.0)
Platelets: 182 10*3/uL (ref 150–400)
RBC: 5.56 MIL/uL (ref 4.22–5.81)
RDW: 14.1 % (ref 11.5–15.5)
WBC: 5.7 10*3/uL (ref 4.0–10.5)
nRBC: 0 % (ref 0.0–0.2)

## 2020-06-13 LAB — ACETAMINOPHEN LEVEL: Acetaminophen (Tylenol), Serum: <10 ug/mL — ABNORMAL LOW (ref 10–30)

## 2020-06-13 LAB — SALICYLATE LEVEL: Salicylate Lvl: <7 mg/dL — ABNORMAL LOW (ref 7.0–30.0)

## 2020-06-13 MED ORDER — HYDROXYZINE HCL 25 MG PO TABS
25.0000 mg | ORAL_TABLET | Freq: Every day | ORAL | Status: DC
  Administered 2020-06-13: 25 mg via ORAL
  Filled 2020-06-13 (×2): qty 1

## 2020-06-13 MED ORDER — STERILE WATER FOR INJECTION IJ SOLN
INTRAMUSCULAR | Status: AC
  Filled 2020-06-13: qty 10

## 2020-06-13 MED ORDER — LORAZEPAM 1 MG PO TABS
1.5000 mg | ORAL_TABLET | Freq: Three times a day (TID) | ORAL | Status: DC
  Administered 2020-06-13 – 2020-06-14 (×5): 1.5 mg via ORAL
  Filled 2020-06-13 (×2): qty 1
  Filled 2020-06-13: qty 2
  Filled 2020-06-13 (×2): qty 1

## 2020-06-13 MED ORDER — ZIPRASIDONE MESYLATE 20 MG IM SOLR
INTRAMUSCULAR | Status: AC
  Administered 2020-06-13: 20 mg via INTRAMUSCULAR
  Filled 2020-06-13: qty 20

## 2020-06-13 MED ORDER — MIRTAZAPINE 15 MG PO TABS
15.0000 mg | ORAL_TABLET | Freq: Every day | ORAL | Status: DC
  Administered 2020-06-13: 15 mg via ORAL
  Filled 2020-06-13: qty 1

## 2020-06-13 MED ORDER — ZIPRASIDONE MESYLATE 20 MG IM SOLR
20.0000 mg | Freq: Once | INTRAMUSCULAR | Status: AC
  Administered 2020-06-13: 20 mg via INTRAMUSCULAR
  Filled 2020-06-13: qty 20

## 2020-06-13 MED ORDER — CHLORPROMAZINE HCL 25 MG PO TABS: 100.0000 mg | ORAL_TABLET | ORAL | Status: DC

## 2020-06-13 MED ORDER — LORATADINE 10 MG PO TABS
10.0000 mg | ORAL_TABLET | Freq: Every day | ORAL | Status: DC
  Administered 2020-06-13 – 2020-06-14 (×2): 10 mg via ORAL
  Filled 2020-06-13 (×2): qty 1

## 2020-06-13 MED ORDER — CARBAMAZEPINE 200 MG PO TABS
200.0000 mg | ORAL_TABLET | Freq: Two times a day (BID) | ORAL | Status: DC
  Administered 2020-06-13 – 2020-06-14 (×3): 200 mg via ORAL
  Filled 2020-06-13 (×3): qty 1

## 2020-06-13 MED ORDER — AMANTADINE HCL 100 MG PO CAPS
100.0000 mg | ORAL_CAPSULE | Freq: Two times a day (BID) | ORAL | Status: DC
  Administered 2020-06-13 – 2020-06-14 (×2): 100 mg via ORAL
  Filled 2020-06-13 (×4): qty 1

## 2020-06-13 MED ORDER — ZIPRASIDONE MESYLATE 20 MG IM SOLR: 20.0000 mg | Freq: Once | INTRAMUSCULAR | Status: AC

## 2020-06-13 MED ORDER — CHLORPROMAZINE HCL 25 MG PO TABS
200.0000 mg | ORAL_TABLET | Freq: Two times a day (BID) | ORAL | Status: DC
  Administered 2020-06-13 – 2020-06-14 (×3): 200 mg via ORAL
  Filled 2020-06-13 (×3): qty 8

## 2020-06-13 NOTE — ED Triage Notes (Signed)
Pt brought to ED by [employer] from a group home IVC for combative behavior pt punching holes on wall on group home and acting aggressive.

## 2020-06-13 NOTE — ED Notes (Signed)
Jazlynn (Friend#[phone]) called.

## 2020-06-13 NOTE — BH Assessment (Addendum)
Comprehensive Clinical Assessment (CCA) Screening, Triage and Referral Note  06/13/2020 Jonathan Bird [id number]  Visit Diagnosis: IDD, Autism Disposition:  Jonathan Heinrich, NP recommends psychiatric clearance.    [facility] to [facility] via [employer]. Pt became agitated and aggressive at his group home, reportedly punching walls and scratched another resident.. Police report that patient is mostly nonverbal. Pt has a history of Autism, IDD, & Bipolar disorder.  Pt was calm and engaged for TTS assessment. He spoke but was difficult to understand at times. When asked if he got mad at his group home pt recited his address which flowed into naming many different colors of cars. Pt blurted "bowling", seeming to indicate a planned or fun activity at group home.  Pt denies current intention to hurt himself or anyone else. Pt denied hearing things that aren't real/that no one else can hear. He stated he sees things other don't see, but his understanding is unclear.  Pt related to conversation re: not hurting others when he gets mad. Pt stated "sorry, listen, calm down". He was encouraged to hug himself when mad which pt demonstrated and echoed "hug when mad".  Pt lives in a group home and has a legal guardian, Jonathan Bird. Phone contact attempted with both group home numbers and Ms. Jonathan Bird- VM box full at one number and others reached VM. Pt has limited insight and judgment.  ? MSE: Pt is alert, oriented to self with echolalia and normal motor behavior. Eye contact is good. Pt's mood is calm and affect pleasant. Affect is congruent with mood. Thought process is limited. There is no indication pt is currently responding to internal stimuli or experiencing delusional thought content. Pt was cooperative throughout assessment.   Disposition: Jonathan Heinrich, NP recommends psychiatric clearance    ICD-10-CM   1. Aggressive behavior  R46.89     Patient Reported Information How did you hear about [place]? Other  (Comment)   Referral name: [facility] Jonathan Bird [phone]   Jonathan Bird [phone]   Referral phone number: [phone]  What Is the Reason for Your Visit/Call Today? aggressive behavior at group home- scratched someone & punched holes in walls  How Long Has This Been Causing You Problems? <Week  Have You Recently Been in Any Inpatient Treatment (Hospital/Detox/Crisis Center/28-Day Program)? No   Name/Location of Program/Hospital:multiple ED visits for aggression   How Long Were You There? No data recorded  When Were You Discharged? No data recorded Have You Ever Received Services From [facility] Before? Yes   Who Do You See at [facility]? Emergency MD  Have You Recently Had Any Thoughts About Hurting Yourself? No   Are You Planning to Commit Suicide/Harm Yourself At This time?  No  Have you Recently Had Thoughts About Hurting Someone Jonathan Bird? No  Have You Used Any Alcohol or Drugs in the Past 24 Hours? No  What Do You Feel Would Help You the Most Today? Medication  Do You Currently Have a Therapist/Psychiatrist? Yes   Name of Therapist/Psychiatrist: through group home   Have You Been Recently Discharged From Any Office Practice or Programs? No    CCA Screening Triage Referral Assessment Type of Contact: Tele-Assessment   Is this Initial or Reassessment? Initial Assessment   Date Telepsych consult ordered in [facility]:  06/13/20   Name and Contact of Legal Guardian:  Jonathan Bird  If Minor and Not Living with Parent(s), Who has Custody? Jonathan Bird  Location of Assessment: [facility] ED  Does Patient  Present under Involuntary Commitment? Yes   IVC Papers Initial File Date: No data recorded  [place] of Residence: [place]  Patient Currently Receiving the Following Services: No data recorded  Determination of Need: Emergent (2 hours)   Options For Referral: Medication Management   Jonathan Heinrich, NP recommends psychiatric  clearance.  Jonathan Rising Suzan Nailer, LCSW

## 2020-06-13 NOTE — ED Notes (Signed)
Pt increased agitation and clapping. Pt given paper and crayons. Pt now calmly drawing.

## 2020-06-13 NOTE — ED Notes (Signed)
[facility] caregiver called and advised will be here in approx 2 hours to assess pt.

## 2020-06-13 NOTE — ED Notes (Addendum)
Pt began escalating.  Unsure of cause.  Began shouting and attempting to throw  Bedside table and portable computer.  Security at bedside.  Pt given Geodon IM.  Approx 30 min later, pt ate lunch and is now resting.

## 2020-06-13 NOTE — ED Notes (Signed)
Psych provider at bedside

## 2020-06-13 NOTE — Progress Notes (Addendum)
Important Update 2:05PM: Patient's guardianship has been transferred to DSS guardian [name] [phone]. Per guardian patient has services with an [facility], [facility] IDD Care Coordinator and is involved with [facility]. Guardian reports she will reach out to the team and follow-up with the group home.  CSW acknowledges consult for assistance getting patient back to his group home. CSW spoke with [facility] at patient's group home ([phone]) and noted their concerns that patient is not ready for discharge. Per [facility] they heard him yelling and throwing things in the background earlier and express frustration as they just told the provider his meds and now they cleared patient for discharge. CSW acknowledges [facility] concerns and notes [facility] states they cannot take him back at this time. CSW reviewed discharge notice for 30 days and follow-up for an improper discharge. CSW noted they requested to speak with provider who cleared patient and CSW provided [facility] with contact phone per provider's request Inetta Fermo, FNP). CSW contacted [facility] DSS on-call APS and notes they will reach out to his DSS guardian Sander Radon to inform her. CSW requested call back.   CSW notes the following barriers for discharge: -[facility] disagreeing with disposition -[facility] concerned for safety of patient returning regarding their staff and residents -DSS legal guardian outside of business hours -Possible concern for improper discharge that may warrant DHHS follow-up -Possible need for higher level of care (per [facility]'s report and beliefs) to be coordinated with [facility] care coordination team outside of business hours

## 2020-06-13 NOTE — ED Notes (Signed)
Pt arrived to Rm [facility] via stretcher - noted to be sleeping - respirations even, unlabored. Sitter w/pt.

## 2020-06-13 NOTE — ED Notes (Signed)
Pt woke - ambulated to bathroom then back to room.

## 2020-06-13 NOTE — Consult Note (Signed)
  Patient is designated legal guardian, Jonathan Bird [phone], telephone writer to provide collateral information. Patient is legal guardian suggest "behavioral outburst may be related to recently going through changes after living in a household for a number of years moved to a facility and then moved to a new group home on Monday, June 28."  Per guardian "patient has a hard time expressing feelings in the group home and the group home decided, without contacting guardian, not to have patient return to his day program through the [facility] therefore he missed his typical Friday that includes hamburgers and bowling."  Patient's guardian reports plan to follow-up with group home to facilitate patient's return to group home.

## 2020-06-13 NOTE — ED Notes (Signed)
Pt noted to be banging hands on bedside table intermittently. Pt given paper and crayons.

## 2020-06-13 NOTE — ED Notes (Signed)
Call [facility] care giver at [phone] with any possible update
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
## 2020-06-13 NOTE — ED Notes (Signed)
Pt sitting in chair listening to music on computer.

## 2020-06-13 NOTE — ED Notes (Signed)
Jonathan Bird, grand mother, [phone] would like an update when available

## 2020-06-13 NOTE — ED Provider Notes (Signed)
Emergency Medicine Observation Re-evaluation Note  Jonathan Bird is a 29 y.o. male, seen on rounds today.  Pt initially presented to the ED for complaints of Medical Clearance (IVC) and Autism Currently, the patient is resting comfortably   Physical Exam  BP 140/90 (BP Location: Left Arm)   Pulse 100   Temp 99.1 F (37.3 C) (Oral)   Resp 18   Ht 5\' 10"  (1.778 m)   Wt 87.1 kg   SpO2 100%   BMI 27.55 kg/m  Physical Exam Sleeping soundly, no distress  ED Course / MDM  EKG:     I have reviewed the labs performed to date as well as medications administered while in observation.  Recent changes in the last 24 hours include emotional and verbally aggressive outburst requiring additional Geodon. Plan  Current plan is for Social Work to work towards alternative group home placement as he will not be able to go back to the previous group home. He has only been there for a few days and has not adjusted to the transition well. . Patient is under full IVC at this time.   , MD 06/13/20 [phone]
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
## 2020-06-13 NOTE — ED Notes (Signed)
Lunch Tray Ordered @ 1040. 

## 2020-06-13 NOTE — Consult Note (Signed)
  Psychiatry consult completed by nurse practitioner.  Patient involuntarily committed by group home related to combative behavior.  Patient states "what is up, bowling."  Patient awake and alert upon my assessment.  Patient currently seated and coloring with crayons. Patient has history of autism spectrum disorder as well as intellectual disability.  Patient oriented to self only at this time, appears to be patient's baseline. Patient appears calm and appropriate at this time.  No behavioral outburst noted during emergency department stay.  Patient is easily redirectable and does not appear to be responding to internal stimuli. Patient endorses appropriate sleep and appetite. Patient offered support and encouragement.  Attempted to reach patient's current group home, currently no answer, unable to leave message.  Patient discussed with psychiatrist, Dr. Lucianne Muss as well as attending physician Dr. Adela Lank.  Patient cleared by psychiatry.

## 2020-06-13 NOTE — ED Notes (Addendum)
Staffing notified about sitter need. Pt changed to [facility] scrubs. [employer] at his side.

## 2020-06-13 NOTE — ED Notes (Signed)
Pt refusing to wear ID bracelet - removes each time it is replaced. Pt asking for his belongings repeatedly. Pt ambulating to bathroom to flush toilet multiple times then returns to room.

## 2020-06-13 NOTE — ED Notes (Signed)
Amanda w/[place] [facility] in w/pt.

## 2020-06-14 DIAGNOSIS — F840 Autistic disorder: Secondary | ICD-10-CM | POA: Diagnosis not present

## 2020-06-14 MED ORDER — ACETAMINOPHEN 325 MG PO TABS
650.0000 mg | ORAL_TABLET | Freq: Once | ORAL | Status: AC
  Administered 2020-06-14: 650 mg via ORAL
  Filled 2020-06-14: qty 2

## 2020-06-14 NOTE — ED Notes (Signed)
Pt's breakfast arrived 

## 2020-06-14 NOTE — ED Notes (Signed)
TC to group home at # [phone] to ask when staff will pick up [name] NP reported in her note the Group home will pick Jonathan Bird up today at 1700. No one answered the call placed . A message was left.

## 2020-06-14 NOTE — ED Notes (Signed)
Pt's dinner ordered °

## 2020-06-14 NOTE — ED Notes (Signed)
TC placed to [name] the [facility]  From [facility] Group home. [place] ,[place] spoke to [place] about Pt's Meds.

## 2020-06-14 NOTE — Progress Notes (Signed)
Patient ID: Jonathan Bird, male   DOB: 05/05/1991, 29 y.o.   MRN: [id number]   Psychiatric reassessment   In brief, Jonathan Bird is a 29 year male who presented to the ED, under IVC, following aggressive behaviors at his current AFL. He has a history of IDD, Autism, and Bipolar disorder. Per chart review. Patient is a poor historian as he is mostly nonverbal at baseline. During this evaluation, he was calm and cooperative. He denied SI, HI and psychosis. He has had no outburst today while in the ED per chart review. I spoke to legal guardian  Jonathan Bird [phone], to update her on patients progress and likely psychiatric clearance resulting in discharge and she had no concerns for discharge. She did state that she was unsure if patient would be able to return back to the AFL. Per chart review, ED staff spoke to [place], [place], and [place], [place] - [phone] yesterday and was advised that the group home was planning for pt to return. I followed back up with Jonathan Bird who confirmed that patient could return back to the group home once he was re-evaluated  and psychotically cleared. I advised Jonathan Bird that patient was psychiatrically cleared. He had no concerns and stated someone from the group home would be to pick patient up today at 5:00 pm.  It was recommended that patient continue ongoing services with current outpatient providers.   Made numerous attempts to contact and update ED on disposition/ psychiatric clearance at numbers [phone] and [phone] ED however, I was unable to reach anyone. Patient again, will be picked up from the ED by group home staff at 5:00 pm today.

## 2020-06-14 NOTE — ED Notes (Signed)
Security back to purple zone. Group home member here to pick up patient. Was told that pt was not going to be picked up tonight due to confusion with medications. IVC has to be rescinded.

## 2020-06-14 NOTE — ED Provider Notes (Signed)
Emergency Medicine Observation Re-evaluation Note  Jonathan Bird is a 29 y.o. male, seen on rounds today.  Pt initially presented to the ED for complaints of Medical Clearance (IVC) and Autism Currently, the patient is walking around the unit.  Physical Exam  BP 116/85 (BP Location: Right Arm)   Pulse 99   Temp 98.5 F (36.9 C) (Oral)   Resp 16   Ht 5\' 10"  (1.778 m)   Wt 87.1 kg   SpO2 100%   BMI 27.55 kg/m  Physical Exam No acute distress noted. Ambulating without difficulty ED Course / MDM  EKG:    I have reviewed the labs performed to date as well as medications administered while in observation.  Recent changes in the last 24 hours include spoke to group home faculty yesterday.  They stated that patient can return to the group home today 06/14/2020.  However they are requesting patient to be reevaluated by psychiatry in the morning.  I did speak to 08/15/2020 over at the group home because I am unsure why patient needs to be reevaluate by psychiatry when he was psychiatrically cleared yesterday.  They state that "to see if there is anything else to offer and because we need time to repair the damages."  They will come pick him up after this is done today.  I have ordered a tele psych consult. Patient complaining of a headache and was given Tylenol for this.  6:25 PM Patient reevaluated by behavioral health specialist about 3 hours ago.  She again has psychiatrically cleared him.  Please see her note for further detail.  Patient group home faculty again called Everlean Alstrom and was concerned that he was placed on Ativan.  I looked at his [facility] and noted that he was prescribed Ativan in March 2021, but unfortunate this had been ordered for him while he was here.  He does take clonazepam outpatient.  There is no note that indicates that he should be on Ativan again outpatient.  I am unsure why this was ordered for him here.  I explained that since he is on clonazepam which is another benzodiazepine that he  can take this outpatient.  I spoke to the faculty also had other questions regarding psych evaluation.  I gave him the number for the behavioral health team as it will be easier for them answer these questions.  8:06 PM No updates from either behavioral health team or group home.  Patient will not be discharged until we are able to get any updates from behavioral health or group home. Plan  Current plan is for discharge back to group home after being reevaluated by psychiatry. TTS reconsult pending. Patient is under full IVC at this time. This will need to be rescinded prior to discharge.   02-08-1979, PA-C 06/14/20 2006    [address] 06/15/20 [phone]

## 2020-06-14 NOTE — ED Notes (Signed)
Belongings given back to patient. Pt dressed. Pt walked out to meet with group home member to take him home. Given dc instructions and MAR as requested. All questions answered. Pt ambulatory, no distress noted. VSS.

## 2020-06-14 NOTE — ED Notes (Signed)
TTS 

## 2020-06-14 NOTE — ED Notes (Signed)
Lunch Tray Ordered @ 1131. 

## 2020-06-14 NOTE — ED Notes (Signed)
PT awake and ate his meal

## 2020-06-14 NOTE — ED Notes (Signed)
Rescind IVC faxed to magistrate.

## 2020-06-14 NOTE — ED Notes (Addendum)
TC from [place] with group home. He reported a call was placed to [facility] and left a message with [facility] about medication change.  Everlean Alstrom reported as soon as they talk to [facility] . Tel. # [phone]

## 2020-06-14 NOTE — ED Notes (Signed)
TC to [employer] [phone] from group home

## 2020-06-22 ENCOUNTER — Other Ambulatory Visit: Payer: Self-pay

## 2020-06-22 ENCOUNTER — Emergency Department (HOSPITAL_COMMUNITY)
Admission: EM | Admit: 2020-06-22 | Discharge: 2020-08-09 | Disposition: A | Discharge status: Home / Self Care | Payer: Medicare Other | Attending: Emergency Medicine | Admitting: Emergency Medicine

## 2020-06-22 ENCOUNTER — Encounter (HOSPITAL_COMMUNITY): Payer: Self-pay

## 2020-06-22 DIAGNOSIS — R454 Irritability and anger: Secondary | ICD-10-CM | POA: Diagnosis present

## 2020-06-22 DIAGNOSIS — E119 Type 2 diabetes mellitus without complications: Secondary | ICD-10-CM | POA: Insufficient documentation

## 2020-06-22 DIAGNOSIS — R4689 Other symptoms and signs involving appearance and behavior: Secondary | ICD-10-CM

## 2020-06-22 DIAGNOSIS — I10 Essential (primary) hypertension: Secondary | ICD-10-CM | POA: Diagnosis not present

## 2020-06-22 DIAGNOSIS — Z20822 Contact with and (suspected) exposure to covid-19: Secondary | ICD-10-CM | POA: Insufficient documentation

## 2020-06-22 DIAGNOSIS — R456 Violent behavior: Secondary | ICD-10-CM | POA: Insufficient documentation

## 2020-06-22 LAB — BASIC METABOLIC PANEL
Anion gap: 10 (ref 5–15)
BUN: 9 mg/dL (ref 6–20)
CO2: 29 mmol/L (ref 22–32)
Calcium: 8.9 mg/dL (ref 8.9–10.3)
Chloride: 101 mmol/L (ref 98–111)
Creatinine, Ser: 0.84 mg/dL (ref 0.61–1.24)
GFR calc Af Amer: >60 mL/min (ref >60)
GFR calc non Af Amer: >60 mL/min (ref >60)
Glucose, Bld: 117 mg/dL — ABNORMAL HIGH (ref 70–99)
Potassium: 3.8 mmol/L (ref 3.5–5.1)
Sodium: 140 mmol/L (ref 135–145)

## 2020-06-22 LAB — CBC
HCT: 40.4 % (ref 39.0–52.0)
Hemoglobin: 13.2 g/dL (ref 13.0–17.0)
MCH: 26.3 pg (ref 26.0–34.0)
MCHC: 32.7 g/dL (ref 30.0–36.0)
MCV: 80.6 fL (ref 80.0–100.0)
Platelets: 183 10*3/uL (ref 150–400)
RBC: 5.01 MIL/uL (ref 4.22–5.81)
RDW: 13.9 % (ref 11.5–15.5)
WBC: 4 10*3/uL (ref 4.0–10.5)
nRBC: 0 % (ref 0.0–0.2)

## 2020-06-22 MED ORDER — CLONAZEPAM 0.5 MG PO TABS
1.0000 mg | ORAL_TABLET | Freq: Two times a day (BID) | ORAL | Status: DC
  Administered 2020-06-22: 1 mg via ORAL
  Filled 2020-06-22: qty 2

## 2020-06-22 MED ORDER — HYDROXYZINE HCL 25 MG PO TABS
25.0000 mg | ORAL_TABLET | Freq: Three times a day (TID) | ORAL | Status: DC
  Administered 2020-06-22: 25 mg via ORAL
  Filled 2020-06-22: qty 1

## 2020-06-22 MED ORDER — MIRTAZAPINE 7.5 MG PO TABS
15.0000 mg | ORAL_TABLET | Freq: Every day | ORAL | Status: DC
  Administered 2020-06-22 – 2020-08-08 (×48): 15 mg via ORAL
  Filled 2020-06-22 (×48): qty 2

## 2020-06-22 MED ORDER — OLANZAPINE 5 MG PO TBDP
5.0000 mg | ORAL_TABLET | Freq: Three times a day (TID) | ORAL | Status: DC | PRN
  Administered 2020-06-22: 5 mg via ORAL
  Filled 2020-06-22: qty 1

## 2020-06-22 MED ORDER — CARBAMAZEPINE 200 MG PO TABS
200.0000 mg | ORAL_TABLET | Freq: Two times a day (BID) | ORAL | Status: DC
  Administered 2020-06-22 – 2020-08-09 (×96): 200 mg via ORAL
  Filled 2020-06-22 (×96): qty 1

## 2020-06-22 MED ORDER — AMANTADINE HCL 100 MG PO CAPS
100.0000 mg | ORAL_CAPSULE | Freq: Two times a day (BID) | ORAL | Status: DC
  Administered 2020-06-22 – 2020-08-09 (×96): 100 mg via ORAL
  Filled 2020-06-22 (×97): qty 1

## 2020-06-22 MED ORDER — LORAZEPAM 1 MG PO TABS
1.0000 mg | ORAL_TABLET | ORAL | Status: AC | PRN
  Administered 2020-06-22: 1 mg via ORAL
  Filled 2020-06-22: qty 1

## 2020-06-22 MED ORDER — CHLORPROMAZINE HCL 25 MG PO TABS
200.0000 mg | ORAL_TABLET | Freq: Two times a day (BID) | ORAL | Status: DC
  Administered 2020-06-23: 200 mg via ORAL
  Filled 2020-06-22: qty 8

## 2020-06-22 MED ORDER — ZIPRASIDONE MESYLATE 20 MG IM SOLR: 10.0000 mg | Freq: Once | INTRAMUSCULAR | Status: DC
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
## 2020-06-22 NOTE — ED Notes (Signed)
Pt refused EKG. Attempting not to escalate at this time. Will do EKG later.

## 2020-06-22 NOTE — ED Provider Notes (Signed)
[facility]-EMERGENCY DEPT Provider Note   CSN: [id number] Arrival date & time: 06/22/20  2014     History Chief Complaint  Patient presents with  . Aggressive Behavior    Jonathan Bird is a 29 y.o. male.  Patient with hx autism, bipolar, and chronic/recurrent agitated behavior and loud vocalization, presents from magistrate w [employer] with agitated behavior, and intermittent screaming. Pt extremely limited historian - level 5 caveat. No reported recent change in meds. No report of recent physical illness. No trauma. No fevers.   The history is provided by the patient and the police. The history is limited by the condition of the patient.       Past Medical History:  Diagnosis Date  . Autism    outbursts  . Autism spectrum   . Bipolar disorder (HCC)   . Diabetes mellitus without complication (HCC)    boarderline  . Hypertension     Patient Active Problem List   Diagnosis Date Noted  . 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 Intellectual disability 02/04/2020  . Outbursts of anger   . Autism disorder 01/22/2020  . Aggressive behavior 01/22/2020    Past Surgical History:  Procedure Laterality Date  . NO PAST SURGERIES    . TOOTH EXTRACTION N/A 05/25/2014   Procedure: EXTRACTION WISDOM TEETH, Extraction teeth # 1, 16, 17, 32.;  Surgeon: Georgia Lopes, DDS;  Location: [facility] OR;  Service: Oral Surgery;  Laterality: N/A;       No family history on file.  Social History   Tobacco Use  . Smoking status: Never Smoker  . Smokeless tobacco: Never Used  Substance Use Topics  . Alcohol use: No  . Drug use: No    Home Medications Prior to Admission medications   Medication Sig Start Date End Date Taking? Authorizing Provider  amantadine (SYMMETREL) 100 MG capsule Take 1 capsule (100 mg total) by mouth 2 (two) times daily. 02/16/20   Bethann Berkshire, MD  carbamazepine (TEGRETOL) 200 MG tablet Take 1 tablet (200 mg total) by mouth 2 (two) times daily. 02/16/20    Bethann Berkshire, MD  cetirizine (ZYRTEC) 10 MG tablet Take 1 tablet (10 mg total) by mouth daily. Patient not taking: Reported on 06/13/2020 02/16/20   Bethann Berkshire, MD  chlorproMAZINE (THORAZINE) 100 MG tablet Take 1 tablet (100 mg total) by mouth 3 (three) times daily. Patient not taking: Reported on 06/13/2020 02/16/20   Bethann Berkshire, MD  chlorproMAZINE (THORAZINE) 200 MG tablet Take 200 mg by mouth in the morning and at bedtime. 06/08/20   [provider]  clonazePAM (KLONOPIN) 1 MG tablet Take 1 mg by mouth in the morning and at bedtime. 05/31/20   [provider]  hydrOXYzine (ATARAX/VISTARIL) 25 MG tablet Take 1 tablet (25 mg total) by mouth at bedtime. Patient taking differently: Take 25 mg by mouth 3 (three) times daily.  05/02/20   Elson Areas, PA-C  LORazepam (ATIVAN) 0.5 MG tablet Take 3 tablets (1.5 mg total) by mouth 3 (three) times daily. Patient not taking: Reported on 06/13/2020 03/08/20   Rankin, Shuvon B, NP  mirtazapine (REMERON) 15 MG tablet Take 15 mg by mouth at bedtime. 06/08/20   [provider]    Allergies    Patient has no known allergies.  Review of Systems   Review of Systems  Unable to perform ROS: Psychiatric disorder  level 5 caveat, psychiatric disorder, dd.    Physical Exam Updated Vital Signs BP (!) 136/94 (BP  Location: Right Arm)   Pulse 85   Temp (!) 97.4 F (36.3 C) (Oral)   Resp 18   SpO2 97%   Physical Exam Vitals and nursing note reviewed.  Constitutional:      Appearance: Normal appearance. He is well-developed.  HENT:     Head: Atraumatic.     Nose: Nose normal.     Mouth/Throat:     Mouth: Mucous membranes are moist.     Pharynx: Oropharynx is clear.  Eyes:     General: No scleral icterus.    Conjunctiva/sclera: Conjunctivae normal.     Pupils: Pupils are equal, round, and reactive to light.  Neck:     Trachea: No tracheal deviation.  Cardiovascular:     Rate and Rhythm: Normal rate and regular rhythm.       Pulses: Normal pulses.     Heart sounds: Normal heart sounds. No murmur heard.  No friction rub. No gallop.   Pulmonary:     Effort: Pulmonary effort is normal. No accessory muscle usage or respiratory distress.     Breath sounds: Normal breath sounds.  Abdominal:     General: Bowel sounds are normal. There is no distension.     Palpations: Abdomen is soft.     Tenderness: There is no abdominal tenderness.  Genitourinary:    Comments: No cva tenderness. Musculoskeletal:        General: No swelling.     Cervical back: Normal range of motion and neck supple. No rigidity.  Skin:    General: Skin is warm and dry.     Findings: No rash.  Neurological:     Mental Status: He is alert.     Comments: Alert, and awake, sitting upright in chair. Prior to evaluation, repeat screaming out loudly, with periods agitation. On talking with patient, offering food/fluids, patient calmer, no longer screaming, and asks for sandwich, banana, and soda.   Psychiatric:     Comments: Periodic screaming with agitated behavior.      ED Results / Procedures / Treatments   Labs (all labs ordered are listed, but only abnormal results are displayed) Labs Reviewed  BASIC METABOLIC PANEL  CBC    EKG None  Radiology No results found.  Procedures Procedures (including critical care time)  Medications Ordered in ED Medications  OLANZapine zydis (ZYPREXA) disintegrating tablet 5 mg (has no administration in time range)    And  LORazepam (ATIVAN) tablet 1 mg (has no administration in time range)    ED Course  I have reviewed the triage vital signs and the nursing notes.  Pertinent labs & imaging results that were available during my care of the patient were reviewed by me and considered in my medical decision making (see chart for details).    MDM Rules/Calculators/A&P                          Reassurance provided. Po fluids, food.   Periods screaming, agitation. Ativan 1 mg, zyprexa 5 mg.    Reviewed nursing notes and prior charts for additional history.  Several prior ED visits w similar symptoms.   Will get BH eval.   2300, labs and BH eval pending. Signed out to Dr Wilkie Aye to check labs and [facility] eval.  Given pts history, if remains calm/cooperative overnight, best plan may be for return back to group home in AM.      Final Clinical Impression(s) / ED Diagnoses Final diagnoses:  None  Rx / DC Orders ED Discharge Orders    None       Cathren Laine, MD 06/22/20 2302

## 2020-06-23 ENCOUNTER — Encounter (HOSPITAL_COMMUNITY): Payer: Self-pay | Admitting: Registered Nurse

## 2020-06-23 DIAGNOSIS — R456 Violent behavior: Secondary | ICD-10-CM | POA: Diagnosis not present

## 2020-06-23 MED ORDER — ACETAMINOPHEN 325 MG PO TABS
650.0000 mg | ORAL_TABLET | Freq: Once | ORAL | Status: AC
  Administered 2020-06-23: 650 mg via ORAL
  Filled 2020-06-23: qty 2

## 2020-06-23 MED ORDER — CHLORPROMAZINE HCL 25 MG PO TABS
200.0000 mg | ORAL_TABLET | Freq: Two times a day (BID) | ORAL | Status: DC
  Administered 2020-06-24 – 2020-08-09 (×93): 200 mg via ORAL
  Filled 2020-06-23 (×2): qty 8
  Filled 2020-06-23: qty 2
  Filled 2020-06-23: qty 8
  Filled 2020-06-23: qty 2
  Filled 2020-06-23 (×8): qty 8
  Filled 2020-06-23: qty 2
  Filled 2020-06-23 (×5): qty 8
  Filled 2020-06-23: qty 2
  Filled 2020-06-23: qty 8
  Filled 2020-06-23: qty 2
  Filled 2020-06-23 (×4): qty 8
  Filled 2020-06-23: qty 2
  Filled 2020-06-23 (×3): qty 8
  Filled 2020-06-23: qty 2
  Filled 2020-06-23 (×3): qty 8
  Filled 2020-06-23 (×2): qty 2
  Filled 2020-06-23 (×4): qty 8
  Filled 2020-06-23: qty 2
  Filled 2020-06-23 (×5): qty 8
  Filled 2020-06-23: qty 2
  Filled 2020-06-23: qty 8
  Filled 2020-06-23: qty 2
  Filled 2020-06-23 (×6): qty 8
  Filled 2020-06-23 (×2): qty 2
  Filled 2020-06-23 (×10): qty 8
  Filled 2020-06-23: qty 2
  Filled 2020-06-23: qty 8
  Filled 2020-06-23: qty 2
  Filled 2020-06-23: qty 8
  Filled 2020-06-23: qty 2
  Filled 2020-06-23 (×2): qty 8
  Filled 2020-06-23: qty 2
  Filled 2020-06-23 (×14): qty 8
  Filled 2020-06-23: qty 2
  Filled 2020-06-23: qty 8
  Filled 2020-06-23: qty 2
  Filled 2020-06-23 (×4): qty 8
  Filled 2020-06-23: qty 2
  Filled 2020-06-23 (×6): qty 8
  Filled 2020-06-23: qty 2
  Filled 2020-06-23 (×3): qty 8

## 2020-06-23 MED ORDER — ZIPRASIDONE MESYLATE 20 MG IM SOLR
20.0000 mg | Freq: Once | INTRAMUSCULAR | Status: AC
  Administered 2020-06-23: 20 mg via INTRAMUSCULAR
  Filled 2020-06-23: qty 20

## 2020-06-23 MED ORDER — HYDROXYZINE HCL 25 MG PO TABS
50.0000 mg | ORAL_TABLET | Freq: Three times a day (TID) | ORAL | Status: DC
  Administered 2020-06-23 – 2020-08-09 (×140): 50 mg via ORAL
  Filled 2020-06-23 (×141): qty 2

## 2020-06-23 MED ORDER — CLONAZEPAM 0.5 MG PO TABS
0.5000 mg | ORAL_TABLET | Freq: Every day | ORAL | Status: DC
  Administered 2020-06-24 – 2020-08-08 (×46): 0.5 mg via ORAL
  Filled 2020-06-23 (×46): qty 1

## 2020-06-23 MED ORDER — STERILE WATER FOR INJECTION IJ SOLN
INTRAMUSCULAR | Status: AC
  Administered 2020-06-23: 1.2 mL via INTRAMUSCULAR
  Filled 2020-06-23: qty 10
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
## 2020-06-23 NOTE — ED Provider Notes (Signed)
Lab work reviewed.  No significant abnormalities.  Patient is awake, alert, ambulatory.  He requires frequent redirection.  Awaiting TTS and social work consult.   Shon Baton, MD 06/23/20 [phone]

## 2020-06-23 NOTE — Social Work (Signed)
@  1:10pm CSW tried contacting Jonathan Bird [phone] with [facility], there was no answer and no way to leave a vm.    @1 :20pm CSW then reached out to [facility], [facility] [phone], CSW left HIPPA compliant message with my contact information.  @1 :26pm CSW received a call from [phone], Jonathan Bird.  Jonathan Bird informed CSW that [facility] will not be accepting pt back due to health and safety reasons.  She also stated that pts care coordinator and legal guardian, Jonathan Bird [phone]),  will be working  Together to find placement for pt.  @2 :03pm  CSW received a call from [facility], [phone].  She stated that she's began the application process with [facility], and has been making phone calls to area group homes to accept care for pt.  Unfortunately, she has not had any success.  Jonathan Bird will continue her search and keep CSW abreast of her progress.  CSW will continue to follow for dc needs.  Jonathan Bird, MSW, LCSW-A [phone] ED Transitions of [facility] [phone]

## 2020-06-23 NOTE — ED Notes (Signed)
RN at nurses station heard pt screaming in room. RN walked in room to find Pt kicking trash can over. RN witnessed Pt hitting the sitter, throwing objects, hitting self, and hitting wall. RN and sitter tried to verbally calm Pt.  EDP at bedside. Verbal order given of Geodon 20mg  by EDP . Pt allowed this RN to give IM Geodon.

## 2020-06-23 NOTE — Consult Note (Signed)
  Jonathan Bird, 29 y.o., male patient seen face to face by this provider, consulted with Dr. Lucianne Muss; and chart reviewed on 06/23/20.  On evaluation Jonathan Bird lying in bed with covers over his head. Patient asked if lights could be cut on and responded "Yeah."  Patient stated that he was good but head hurt and then pulled the covers back over head.  Patient asked what he had for breakfast this morning "Oatmeal".    Spoke to patients nurse who stated that attempted to keep patient on schedule since familiar with patient prior visit (bath, breakfast) but agitation came from out of no where when he attracted staff member.  Patient was given Geodon prior to my visit to his room when found resting with covers over his head.  During evaluation Jonathan Bird is alert/oriented to place and self; calm/cooperative; and mood is congruent with affect.  Patient limited IQ and speech is child like.  He does not appear to be responding to internal/external stimuli or delusional thoughts.  Patient denies suicidal/self-harm/homicidal ideation.  Patient answered best of his ability.      Recent study by [facility] found that patient has a paradoxical reaction to the combination of his standing Thorazine with lorazepam.  This tends to making patient more disinhibited, agitated and violent.  Patient currently taking Klonopin which is also a benzodiazepine; will decrease dose to 0.5 mg Q hs and patient to continue off.  Will increase Vistaril to 50 mg Tid prn for agitation and anxiety.    Patient was given Ativan 1 mg and Zyprexa zydis 5 mg 06/22/20 at 2138.  Incident with staff and Geodon given at 9:16 am 06/23/20.  Unsure if the patient aggressive behavior was a result of residual effects of the Ativan that was given last night.    Medication adjustments have been made; and social work consult ordered to assist with transportation back to group home.  Care staff is aware of the paradoxical reaction to Ativan and  Thorazine and that it should be avoided in the future.  This is also a new home for patient and there will be issues with the patient adjusting to new facility.    Disposition:  Recommending patient to be discharged back to group home with medication changes and to follow up with current psychiatric provider.   Dr. Bernette Mayers informed of disposition and recommendations.

## 2020-06-23 NOTE — BH Assessment (Signed)
Attempted to assess patient at 5am. Per patient's nurse Carmela Rima, NP, patient was given Geodon approx. 0930. TTS has requested patient's nurse to contact TTS when patient is able to be assessed.

## 2020-06-23 NOTE — ED Notes (Signed)
Pt provided breakfast tray.  Sitter at bedside.  Pt dressed out in purple scrubs, cooperative at this time.

## 2020-06-23 NOTE — BH Assessment (Addendum)
BHH Assessment Progress Note  At 09:46 this Clinical research associate called pt's [facility] IDD care coordinator, [name] [phone]).  She is aware that pt is at [facility] at this time, and I have informed her of the current volatility of our milieu.  She reports that pt's current AFL has not evicted him as of this writing, however, she, the AFL staff and pt's [facility] DSS guardian, [name] all feel that he is too volatile to safely return to that setting at this time.  [name] thinks that it is possible that he will be evicted from the setting later today.  Pt has been there for the past two weeks after having been evicted from his last group home.  She has been seeking another group home for him, but so far has been unsuccessful.  Of note, she reports that [place] [facility] recently had a consult performed by the [facility].  She has sent the report to me and I will both make it available to the psychiatry team and will see that it is scanned into pt's record.  [name] verbally reports that it finds that pt has a paradoxical reaction to the combination of his standing thorazine with lorazepam.  This tends to making pt more disinhibited, agitated and violent.  I have informed the charge nurse at [facility] of this.  [name] reports that pt was very recently at [facility], where she believes that pt was administered this combination of medications, which may have precipitated his current behavior.  In addition to sending this report, [name] also sent me the latest letter of guardianship, assigning pt to [facility] DSS.  I now have the letter and will distribute it accordingly.  [name] agrees to keep me informed of placement developments, as well as her communications with pt's current AFL, and I will likewise keep her up to date on pt's progress at [facility].  At 10:00 I called [name] [phone]), pt's newly assigned DSS guardian.  She corroborates what [name] has told me.  She agrees to reach out to pt's AFL to  see if any comfort items that help to manage pt's behavior have been sent to the ED with the pt.  Historically, these have included certain stuffed animals, music players, tablet computers and portable music keyboards.  Aram Beecham and I agree to stay in communication about pt's progression.  As of this writing, pt is under IVC initiated by pt's AFL staff.  A First Examination is pending as of this writing, as is pt's visit from the psychiatry team.  I will speak to the EDP about the First Examination after I hear from psychiatry.  Of note, pt's record contains psychometric testing establishing an IQ.  This will be critical if it is found that pt needs psychiatric hospitalization at this time, as will any documentation addressing pt's behavior in the ED.  As of this writing, final disposition for this pt is pending.  Doylene Canning, [place] Behavioral Health Coordinator [phone]

## 2020-06-23 NOTE — ED Notes (Signed)
Pt laying prone in bed, covered by blankets.  Equal chest rise noted.

## 2020-06-23 NOTE — BH Assessment (Addendum)
BHH Assessment Progress Note  Per Shuvon Rankin, FNP, this pt does not require psychiatric hospitalization at this time.  This Clinical research associate has had several conversations today with pt's team, including pt's guardian, [name] [phone]), his [facility] care coordinator, [name] [phone]), and his [place] [facility] worker, Thom Chimes [phone]).  At 12:42 [name] left a message for me indicating that pt's AFL has refused to accept him for return.  After consulting with Nelly Rout, MD, and with EDP Susy Frizzle, MD, it has been decided that IVC initiated by pt's AFL staff will be upheld by Dr Bernette Mayers so that pt can be safely managed in the ED for the time being.  Psychiatry will round on pt to manage psychotropic medications.  Social work will collaborate with pt's outpatient team to seek alternative placement for pt.  Names and contacts have been provided for social work, and I have reached out to all of the service providers listed above to offer them contact information for the social work department.  Marchelle Folks reports that she will be sending her evaluation to me by e-mail attachment shortly.  As of this writing, receipt is pending.  Doylene Canning, [place] Behavioral Health Coordinator [phone]

## 2020-06-23 NOTE — ED Notes (Signed)
Thorazine to be given late d/t dose availablity

## 2020-06-23 NOTE — ED Notes (Signed)
Pt's one bag of belongings put in the cabinet across from room 20

## 2020-06-23 NOTE — Social Work (Signed)
Consult request has been received. CSW attempting to follow up at present time. °  °CSW will continue to follow for dc needs. ° °[place] Filice Tarpley-Carter, MSW, LCSW-A °[facility] °Transitions of Care Clinical Social Worker °[place] °[phone] °

## 2020-06-23 NOTE — ED Notes (Signed)
Staff has made patient a Youtube account with a playlist of songs he likes to listen to.  Username: [email or web address] Pswd: [email or web address]

## 2020-06-24 DIAGNOSIS — R456 Violent behavior: Secondary | ICD-10-CM | POA: Diagnosis not present

## 2020-06-24 LAB — SARS CORONAVIRUS 2 BY RT PCR (HOSPITAL ORDER, PERFORMED IN ~~LOC~~ HOSPITAL LAB): SARS Coronavirus 2: NEGATIVE

## 2020-06-24 MED ORDER — ACETAMINOPHEN 325 MG PO TABS
650.0000 mg | ORAL_TABLET | Freq: Once | ORAL | Status: AC
  Administered 2020-06-24: 650 mg via ORAL
  Filled 2020-06-24: qty 2

## 2020-06-24 MED ORDER — OLANZAPINE 5 MG PO TABS
5.0000 mg | ORAL_TABLET | Freq: Two times a day (BID) | ORAL | Status: DC
  Administered 2020-06-24 – 2020-08-09 (×93): 5 mg via ORAL
  Filled 2020-06-24 (×93): qty 1

## 2020-06-24 MED ORDER — STERILE WATER FOR INJECTION IJ SOLN
INTRAMUSCULAR | Status: AC
  Filled 2020-06-24: qty 10

## 2020-06-24 MED ORDER — ZIPRASIDONE MESYLATE 20 MG IM SOLR
20.0000 mg | Freq: Every day | INTRAMUSCULAR | Status: DC | PRN
  Administered 2020-06-24 – 2020-07-28 (×18): 20 mg via INTRAMUSCULAR
  Filled 2020-06-24 (×23): qty 20

## 2020-06-24 NOTE — Progress Notes (Addendum)
2nd shift ED CSW received a handoff from the 1st shift [facility] ED CSW.   CSW collected collateral from the psychiatric team and was advised of the pt's child-like nature AEB when some childrens toys were brought in by a RN, the pt was particularly fascinated and occupied by said toys which resulted in the RN's and psychiatric FNPs' observation that pt was child-like and lacked insight.  Also per the psychiatric NP and per chart review pt became particularly disturbed due what he described was a bad smell that turned out to be the result of the pt passing gas.  Per psychiatry pt lacked insight that his flatulence was the cause of the smell and woke up in a disturbed state as a result of his this but lacked the insight at 29 years old to know what the cause was.  CSW will continue to follow for D/C needs.  Jonathan Bird. Jonathan Bird  MSW, LCSW, LCAS, CCS Transitions of Care Clinical Social Worker Care Coordination Department Ph: [phone]

## 2020-06-24 NOTE — ED Notes (Signed)
Pt began banging on the wall in room .  Security was present at that time and he continued to escalate.  He finally became aggressive and started swinging at staff and security took him down safely.  He was scratching and biting.

## 2020-06-24 NOTE — ED Notes (Signed)
Pt awoke and got out of bed stating "it stinks" but was unable to communicate exactly what was stinking. Examining pt clothing and no soiled areas were noted and it is believed pt is passing gas.

## 2020-06-24 NOTE — NC FL2 (Addendum)
[place] MEDICAID FL2 LEVEL OF CARE SCREENING TOOL     IDENTIFICATION  Patient Name: Jonathan Bird Birthdate: Sep 09, 1991 Sex: male Admission Date (Current Location): 06/22/2020  [place] and [place] Number:  [address] [id number] Facility and Address:  [facility],  [address], [place] [address]      Provider Number: [id number]  Attending Physician Name and Address:  Default, Provider, MD  Relative Name and Phone Number:  [name] DSS Legal Guardian, # [phone]    Current Level of Care: Hospital Recommended Level of Care: Other (Comment) (group home) Prior Approval Number:    Date Approved/Denied:   PASRR Number:    Discharge Plan: Other (Comment) (group home)    Current Diagnoses: Patient Active Problem List   Diagnosis Date Noted  . 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 Intellectual disability 02/04/2020  . Outbursts of anger   . Autism disorder 01/22/2020  . Aggressive behavior 01/22/2020    Orientation RESPIRATION BLADDER Height & Weight     Self, Place  Normal Continent Weight: 5'10" Height:  87.1 kg  BEHAVIORAL SYMPTOMS/MOOD NEUROLOGICAL BOWEL NUTRITION STATUS  Dangerous to self, others or property   Continent Diet (regular diet)  AMBULATORY STATUS COMMUNICATION OF NEEDS Skin   Independent Non-Verbally (limited speech) Normal                       Personal Care Assistance Level of Assistance  Bathing, Dressing, Feeding Bathing Assistance: Independent Feeding assistance: Independent Dressing Assistance: Independent     Functional Limitations Info  Sight, Hearing, Speech Sight Info: Adequate Hearing Info: Adequate Speech Info: Impaired    SPECIAL CARE FACTORS FREQUENCY        Contractures Contractures Info: Not present    Additional Factors Info  Code Status, Allergies Code Status Info: Full Code Allergies Info: Ativan           Current Medications (06/24/2020):  This is the current hospital active  medication list Current Facility-Administered Medications  Medication Dose Route Frequency Provider Last Rate Last Admin  . amantadine (SYMMETREL) capsule 100 mg  100 mg Oral BID Cathren Laine, MD   100 mg at 06/24/20 1056  . carbamazepine (TEGRETOL) tablet 200 mg  200 mg Oral BID Cathren Laine, MD   200 mg at 06/24/20 1056  . chlorproMAZINE (THORAZINE) tablet 200 mg  200 mg Oral BID Mancel Bale, MD   200 mg at 06/24/20 1056  . clonazePAM (KLONOPIN) tablet 0.5 mg  0.5 mg Oral QHS Rankin, Shuvon B, NP      . hydrOXYzine (ATARAX/VISTARIL) tablet 50 mg  50 mg Oral TID Rankin, Shuvon B, NP   50 mg at 06/24/20 1056  . mirtazapine (REMERON) tablet 15 mg  15 mg Oral QHS Cathren Laine, MD   15 mg at 06/23/20 2146  . sterile water (preservative free) injection           . ziprasidone (GEODON) injection 20 mg  20 mg Intramuscular Daily PRN Cardama, Amadeo Garnet, MD   20 mg at 06/24/20 0840   Current Outpatient Medications  Medication Sig Dispense Refill  . amantadine (SYMMETREL) 100 MG capsule Take 1 capsule (100 mg total) by mouth 2 (two) times daily. 60 capsule 0  . carbamazepine (TEGRETOL) 200 MG tablet Take 1 tablet (200 mg total) by mouth 2 (two) times daily. 60 tablet 0  . chlorproMAZINE (THORAZINE) 200 MG tablet Take 200 mg by mouth 3 (three) times daily.     . clonazePAM (  KLONOPIN) 1 MG tablet Take 1 mg by mouth in the morning and at bedtime.    . hydrOXYzine (ATARAX/VISTARIL) 25 MG tablet Take 1 tablet (25 mg total) by mouth at bedtime. (Patient taking differently: Take 25 mg by mouth 3 (three) times daily. ) 30 tablet 0  . mirtazapine (REMERON) 15 MG tablet Take 15 mg by mouth at bedtime.    . cetirizine (ZYRTEC) 10 MG tablet Take 1 tablet (10 mg total) by mouth daily. (Patient not taking: Reported on 06/13/2020) 30 tablet 0  . chlorproMAZINE (THORAZINE) 100 MG tablet Take 1 tablet (100 mg total) by mouth 3 (three) times daily. (Patient not taking: Reported on 06/13/2020) 90 tablet 0  .  LORazepam (ATIVAN) 0.5 MG tablet Take 3 tablets (1.5 mg total) by mouth 3 (three) times daily. (Patient not taking: Reported on 06/13/2020) 30 tablet 0     Discharge Medications: Please see discharge summary for a list of discharge medications.  Relevant Imaging Results:  Relevant Lab Results:   Additional Information SS# [id number]  Elliot Cousin, RN

## 2020-06-24 NOTE — Consult Note (Addendum)
[facility] Psych ED Progress Note  06/24/2020 4:09 PM Jonathan Bird  MRN:  [id number] Subjective: Patient states "pink car, white bear, orange bear, have to wait."  Patient reports having [place] toast for breakfast.  Patient assessed by nurse practitioner.  Patient calm during telepsychiatry assessment. Per attending RN patient had 1 behavioral outburst this morning requiring as needed medication.   Principal Problem: Outbursts of anger Diagnosis:  Principal Problem:   Outbursts of anger  Total Time spent with patient: 15 minutes  Past Psychiatric History: Autism spectrum disorder, aggressive behavior, intellectual disability, outbursts of anger  Past Medical History:  Past Medical History:  Diagnosis Date  . Autism    outbursts  . Autism spectrum   . Bipolar disorder (HCC)   . Diabetes mellitus without complication (HCC)    boarderline  . Hypertension     Past Surgical History:  Procedure Laterality Date  . NO PAST SURGERIES    . TOOTH EXTRACTION N/A 05/25/2014   Procedure: EXTRACTION WISDOM TEETH, Extraction teeth # 1, 16, 17, 32.;  Surgeon: Georgia Lopes, DDS;  Location: [facility] OR;  Service: Oral Surgery;  Laterality: N/A;   Family History: History reviewed. No pertinent family history. Family Psychiatric  History: None reported Social History:  Social History   Substance and Sexual Activity  Alcohol Use No     Social History   Substance and Sexual Activity  Drug Use No    Social History   Socioeconomic History  . Marital status: Single    Spouse name: Not on file  . Number of children: Not on file  . Years of education: Not on file  . Highest education level: Not on file  Occupational History  . Not on file  Tobacco Use  . Smoking status: Never Smoker  . Smokeless tobacco: Never Used  Substance and Sexual Activity  . Alcohol use: No  . Drug use: No  . Sexual activity: Never  Other Topics Concern  . Not on file  Social History Narrative  . Not on file    Social Determinants of Health   Financial Resource Strain:   . Difficulty of Paying Living Expenses:   Food Insecurity:   . Worried About Programme researcher, broadcasting/film/video in the Last Year:   . Barista in the Last Year:   Transportation Needs:   . Freight forwarder (Medical):   Marland Kitchen Lack of Transportation (Non-Medical):   Physical Activity:   . Days of Exercise per Week:   . Minutes of Exercise per Session:   Stress:   . Feeling of Stress :   Social Connections:   . Frequency of Communication with Friends and Family:   . Frequency of Social Gatherings with Friends and Family:   . Attends Religious Services:   . Active Member of Clubs or Organizations:   . Attends Banker Meetings:   Marland Kitchen Marital Status:     Sleep: Fair  Appetite:  Good  Current Medications: Current Facility-Administered Medications  Medication Dose Route Frequency Provider Last Rate Last Admin  . amantadine (SYMMETREL) capsule 100 mg  100 mg Oral BID Cathren Laine, MD   100 mg at 06/24/20 1056  . carbamazepine (TEGRETOL) tablet 200 mg  200 mg Oral BID Cathren Laine, MD   200 mg at 06/24/20 1056  . chlorproMAZINE (THORAZINE) tablet 200 mg  200 mg Oral BID Mancel Bale, MD   200 mg at 06/24/20 1056  . clonazePAM (KLONOPIN) tablet 0.5 mg  0.5 mg Oral QHS Rankin, Shuvon B, NP      . hydrOXYzine (ATARAX/VISTARIL) tablet 50 mg  50 mg Oral TID Rankin, Shuvon B, NP   50 mg at 06/24/20 1056  . mirtazapine (REMERON) tablet 15 mg  15 mg Oral QHS Cathren Laine, MD   15 mg at 06/23/20 2146  . sterile water (preservative free) injection           . ziprasidone (GEODON) injection 20 mg  20 mg Intramuscular Daily PRN Cardama, Amadeo Garnet, MD   20 mg at 06/24/20 0840   Current Outpatient Medications  Medication Sig Dispense Refill  . amantadine (SYMMETREL) 100 MG capsule Take 1 capsule (100 mg total) by mouth 2 (two) times daily. 60 capsule 0  . carbamazepine (TEGRETOL) 200 MG tablet Take 1 tablet (200 mg  total) by mouth 2 (two) times daily. 60 tablet 0  . chlorproMAZINE (THORAZINE) 200 MG tablet Take 200 mg by mouth 3 (three) times daily.     . clonazePAM (KLONOPIN) 1 MG tablet Take 1 mg by mouth in the morning and at bedtime.    . hydrOXYzine (ATARAX/VISTARIL) 25 MG tablet Take 1 tablet (25 mg total) by mouth at bedtime. (Patient taking differently: Take 25 mg by mouth 3 (three) times daily. ) 30 tablet 0  . mirtazapine (REMERON) 15 MG tablet Take 15 mg by mouth at bedtime.    . cetirizine (ZYRTEC) 10 MG tablet Take 1 tablet (10 mg total) by mouth daily. (Patient not taking: Reported on 06/13/2020) 30 tablet 0  . chlorproMAZINE (THORAZINE) 100 MG tablet Take 1 tablet (100 mg total) by mouth 3 (three) times daily. (Patient not taking: Reported on 06/13/2020) 90 tablet 0  . LORazepam (ATIVAN) 0.5 MG tablet Take 3 tablets (1.5 mg total) by mouth 3 (three) times daily. (Patient not taking: Reported on 06/13/2020) 30 tablet 0    Lab Results:  Results for orders placed or performed during the hospital encounter of 06/22/20 (from the past 48 hour(s))  Basic metabolic panel     Status: Abnormal   Collection Time: 06/22/20 11:00 PM  Result Value Ref Range   Sodium 140 135 - 145 mmol/L   Potassium 3.8 3.5 - 5.1 mmol/L   Chloride 101 98 - 111 mmol/L   CO2 29 22 - 32 mmol/L   Glucose, Bld 117 (H) 70 - 99 mg/dL    Comment: Glucose reference range applies only to samples taken after fasting for at least 8 hours.   BUN 9 6 - 20 mg/dL   Creatinine, Ser 5.72 0.61 - 1.24 mg/dL   Calcium 8.9 8.9 - 62.0 mg/dL   GFR calc non Af Amer >60 >60 mL/min   GFR calc Af Amer >60 >60 mL/min   Anion gap 10 5 - 15    Comment: Performed at [facility], [address], [place], [place] [address]  CBC     Status: None   Collection Time: 06/22/20 11:00 PM  Result Value Ref Range   WBC 4.0 4.0 - 10.5 K/uL   RBC 5.01 4.22 - 5.81 MIL/uL   Hemoglobin 13.2 13.0 - 17.0 g/dL   HCT 41.6 39 - 52 %   MCV 80.6  80.0 - 100.0 fL   MCH 26.3 26.0 - 34.0 pg   MCHC 32.7 30.0 - 36.0 g/dL   RDW 38.4 53.6 - 46.8 %   Platelets 183 150 - 400 K/uL   nRBC 0.0 0.0 - 0.2 %  Comment: Performed at [facility], [address], [place], [place] [address]    Blood Alcohol level:  Lab Results  Component Value Date   ETH <10 06/13/2020   ETH <10 03/18/2020    Physical Findings: AIMS:  , ,  ,  ,    CIWA:    COWS:     Musculoskeletal: Strength & Muscle Tone: within normal limits Gait & Station: normal Patient leans: N/A  Psychiatric Specialty Exam: Physical Exam Vitals and nursing note reviewed.  Constitutional:      Appearance: He is well-developed.  HENT:     Head: Normocephalic.  Cardiovascular:     Rate and Rhythm: Normal rate.  Pulmonary:     Effort: Pulmonary effort is normal.  Neurological:     Mental Status: He is alert. Mental status is at baseline.     Review of Systems  Constitutional: Negative.   HENT: Negative.   Eyes: Negative.   Respiratory: Negative.   Cardiovascular: Negative.   Gastrointestinal: Negative.   Genitourinary: Negative.   Musculoskeletal: Negative.   Skin: Negative.   Neurological: Negative.   Psychiatric/Behavioral: Positive for behavioral problems.    Blood pressure 138/88, pulse 90, temperature 97.9 F (36.6 C), temperature source Oral, resp. rate 18, SpO2 99 %.There is no height or weight on file to calculate BMI.  General Appearance: Fairly Groomed  Eye Contact:  Minimal  Speech:  Slow  Volume:  Normal  Mood:  Euthymic  Affect:  Blunt  Thought Process:  Goal Directed  Orientation:  Other:  Patient oriented to self only, appears at baseline  Thought Content:  Unable to assess  Suicidal Thoughts:  No  Homicidal Thoughts:  No  Memory:  Unable to assess  Judgement:  Impaired  Insight:  Lacking  Psychomotor Activity:  Normal  Concentration:  Concentration: Fair  Recall:  Poor  Fund of Knowledge:  Poor  Language:  Poor   Akathisia:  No  Handed:  Right  AIMS (if indicated):     Assets:  Financial Resources/Insurance Housing Physical Health Resilience Social Support  ADL's: Unable to assess  Cognition:  Impaired,  Moderate  Sleep:         Treatment Plan Summary: Patient reviewed with Dr. Lucianne Muss.  Patient does not require inpatient psychiatric treatment at this time. Patient's AFL will not allow patient to return.  Patient to remain in emergency department while alternative placement is secured by social work.  Patient behavior has been uncooperative, EKG ordered.   Recommendation: Zyprexa 5 mg by mouth twice daily   Patrcia Dolly, FNP 06/24/2020, 4:09 PM

## 2020-06-24 NOTE — ED Provider Notes (Signed)
Emergency Medicine Observation Re-evaluation Note  Jonathan Bird is a 29 y.o. male, seen on rounds today.  Pt initially presented to the ED for complaints of Aggressive Behavior Currently, the patient is awaiting placement.  Physical Exam  BP 138/88 (BP Location: Right Arm)   Pulse 90   Temp 97.9 F (36.6 C) (Oral)   Resp 18   SpO2 99%  Physical Exam Constitutional:      Comments: Patient was resting on the bed.  As I came in room he immediately got up and started walking around the room and gesturing.  He is smiling and doing repetitive motions.  No respiratory distress.  Well in appearance.  Eyes:     Extraocular Movements: Extraocular movements intact.  Cardiovascular:     Rate and Rhythm: Normal rate and regular rhythm.  Pulmonary:     Effort: Pulmonary effort is normal.     Breath sounds: Normal breath sounds.  Musculoskeletal:        General: Normal range of motion.  Skin:    General: Skin is warm and dry.  Neurological:     General: No focal deficit present.     Coordination: Coordination normal.     ED Course / MDM  EKG:    I have reviewed the labs performed to date as well as medications administered while in observation.  Recent changes in the last 24 hours include none. Plan  Current plan is for placement. Patient is under full IVC at this time.  Patient is alert.  He easily escalates with interaction but at this time is pleasant.  No changes currently.   Arby Barrette, MD 06/24/20 1309

## 2020-06-25 DIAGNOSIS — R456 Violent behavior: Secondary | ICD-10-CM | POA: Diagnosis not present

## 2020-06-25 MED ORDER — ACETAMINOPHEN 500 MG PO TABS
1000.0000 mg | ORAL_TABLET | Freq: Once | ORAL | Status: AC
  Administered 2020-06-25: 1000 mg via ORAL
  Filled 2020-06-25: qty 2

## 2020-06-25 NOTE — Consult Note (Signed)
[facility] Psych ED Progress Note  06/25/2020 10:37 AM Jonathan Bird  MRN:  [id number] Subjective:  06/25/2020 patient reassessed by nurse practitioner.  Patient states "pink color, yellow color, red color, orange, brown bear."  Patient reports enjoying sausage and Egg muffin for breakfast.  Patient denies needs or concerns.  Patient observed taking scheduled medications by mouth from attending RN during my assessment.     07/15/2021Patient states "pink car, white bear, orange bear, have to wait."  Patient reports having [place] toast for breakfast.  Patient assessed by nurse practitioner.  Patient calm during telepsychiatry assessment. Per attending RN patient had 1 behavioral outburst this morning requiring as needed medication.   Principal Problem: Outbursts of anger Diagnosis:  Principal Problem:   Outbursts of anger  Total Time spent with patient: 15 minutes  Past Psychiatric History: Autism spectrum disorder, aggressive behavior, intellectual disability, outbursts of anger  Past Medical History:  Past Medical History:  Diagnosis Date  . Autism    outbursts  . Autism spectrum   . Bipolar disorder (HCC)   . Diabetes mellitus without complication (HCC)    boarderline  . Hypertension     Past Surgical History:  Procedure Laterality Date  . NO PAST SURGERIES    . TOOTH EXTRACTION N/A 05/25/2014   Procedure: EXTRACTION WISDOM TEETH, Extraction teeth # 1, 16, 17, 32.;  Surgeon: Georgia Lopes, DDS;  Location: [facility] OR;  Service: Oral Surgery;  Laterality: N/A;   Family History: History reviewed. No pertinent family history. Family Psychiatric  History: None reported Social History:  Social History   Substance and Sexual Activity  Alcohol Use No     Social History   Substance and Sexual Activity  Drug Use No    Social History   Socioeconomic History  . Marital status: Single    Spouse name: Not on file  . Number of children: Not on file  . Years of education: Not on file   . Highest education level: Not on file  Occupational History  . Not on file  Tobacco Use  . Smoking status: Never Smoker  . Smokeless tobacco: Never Used  Substance and Sexual Activity  . Alcohol use: No  . Drug use: No  . Sexual activity: Never  Other Topics Concern  . Not on file  Social History Narrative  . Not on file   Social Determinants of Health   Financial Resource Strain:   . Difficulty of Paying Living Expenses:   Food Insecurity:   . Worried About Programme researcher, broadcasting/film/video in the Last Year:   . Barista in the Last Year:   Transportation Needs:   . Freight forwarder (Medical):   Marland Kitchen Lack of Transportation (Non-Medical):   Physical Activity:   . Days of Exercise per Week:   . Minutes of Exercise per Session:   Stress:   . Feeling of Stress :   Social Connections:   . Frequency of Communication with Friends and Family:   . Frequency of Social Gatherings with Friends and Family:   . Attends Religious Services:   . Active Member of Clubs or Organizations:   . Attends Banker Meetings:   Marland Kitchen Marital Status:     Sleep: Good  Appetite:  Good  Current Medications: Current Facility-Administered Medications  Medication Dose Route Frequency Provider Last Rate Last Admin  . amantadine (SYMMETREL) capsule 100 mg  100 mg Oral BID Cathren Laine, MD   100 mg at 06/25/20  3710  . carbamazepine (TEGRETOL) tablet 200 mg  200 mg Oral BID Cathren Laine, MD   200 mg at 06/25/20 0944  . chlorproMAZINE (THORAZINE) tablet 200 mg  200 mg Oral BID Mancel Bale, MD   200 mg at 06/25/20 0944  . clonazePAM (KLONOPIN) tablet 0.5 mg  0.5 mg Oral QHS Rankin, Shuvon B, NP   0.5 mg at 06/24/20 2105  . hydrOXYzine (ATARAX/VISTARIL) tablet 50 mg  50 mg Oral TID Rankin, Shuvon B, NP   50 mg at 06/25/20 0945  . mirtazapine (REMERON) tablet 15 mg  15 mg Oral QHS Cathren Laine, MD   15 mg at 06/24/20 2104  . OLANZapine (ZYPREXA) tablet 5 mg  5 mg Oral BID Patrcia Dolly, FNP    5 mg at 06/25/20 0944  . ziprasidone (GEODON) injection 20 mg  20 mg Intramuscular Daily PRN Cardama, Amadeo Garnet, MD   20 mg at 06/24/20 0840   Current Outpatient Medications  Medication Sig Dispense Refill  . amantadine (SYMMETREL) 100 MG capsule Take 1 capsule (100 mg total) by mouth 2 (two) times daily. 60 capsule 0  . carbamazepine (TEGRETOL) 200 MG tablet Take 1 tablet (200 mg total) by mouth 2 (two) times daily. 60 tablet 0  . chlorproMAZINE (THORAZINE) 200 MG tablet Take 200 mg by mouth 3 (three) times daily.     . clonazePAM (KLONOPIN) 1 MG tablet Take 1 mg by mouth in the morning and at bedtime.    . hydrOXYzine (ATARAX/VISTARIL) 25 MG tablet Take 1 tablet (25 mg total) by mouth at bedtime. (Patient taking differently: Take 25 mg by mouth 3 (three) times daily. ) 30 tablet 0  . mirtazapine (REMERON) 15 MG tablet Take 15 mg by mouth at bedtime.    . cetirizine (ZYRTEC) 10 MG tablet Take 1 tablet (10 mg total) by mouth daily. (Patient not taking: Reported on 06/13/2020) 30 tablet 0  . chlorproMAZINE (THORAZINE) 100 MG tablet Take 1 tablet (100 mg total) by mouth 3 (three) times daily. (Patient not taking: Reported on 06/13/2020) 90 tablet 0  . LORazepam (ATIVAN) 0.5 MG tablet Take 3 tablets (1.5 mg total) by mouth 3 (three) times daily. (Patient not taking: Reported on 06/13/2020) 30 tablet 0    Lab Results:  Results for orders placed or performed during the hospital encounter of 06/22/20 (from the past 48 hour(s))  SARS Coronavirus 2 by RT PCR (hospital order, performed in [facility] hospital lab) Nasopharyngeal Nasopharyngeal Swab     Status: None   Collection Time: 06/24/20  7:33 PM   Specimen: Nasopharyngeal Swab  Result Value Ref Range   SARS Coronavirus 2 NEGATIVE NEGATIVE    Comment: (NOTE) SARS-CoV-2 target nucleic acids are NOT DETECTED.  The SARS-CoV-2 RNA is generally detectable in upper and lower respiratory specimens during the acute phase of infection. The  lowest concentration of SARS-CoV-2 viral copies this assay can detect is 250 copies / mL. A negative result does not preclude SARS-CoV-2 infection and should not be used as the sole basis for treatment or other patient management decisions.  A negative result may occur with improper specimen collection / handling, submission of specimen other than nasopharyngeal swab, presence of viral mutation(s) within the areas targeted by this assay, and inadequate number of viral copies (<250 copies / mL). A negative result must be combined with clinical observations, patient history, and epidemiological information.  Fact Sheet for Patients:   BoilerBrush.com.cy  Fact Sheet for Healthcare Providers: https://pope.com/  This test  is not yet approved or  cleared by the [place] and has been authorized for detection and/or diagnosis of SARS-CoV-2 by FDA under an Emergency Use Authorization (EUA).  This EUA will remain in effect (meaning this test can be used) for the duration of the COVID-19 declaration under Section 564(b)(1) of the Act, 21 U.S.C. section 360bbb-3(b)(1), unless the authorization is terminated or revoked sooner.  Performed at [facility], [address], [place], [place] [address]     Blood Alcohol level:  Lab Results  Component Value Date   ETH <10 06/13/2020   ETH <10 03/18/2020    Physical Findings: AIMS:  , ,  ,  ,    CIWA:    COWS:     Musculoskeletal: Strength & Muscle Tone: within normal limits Gait & Station: normal Patient leans: N/A  Psychiatric Specialty Exam: Physical Exam Vitals and nursing note reviewed.  Constitutional:      Appearance: He is well-developed.  HENT:     Head: Normocephalic.  Cardiovascular:     Rate and Rhythm: Normal rate.  Pulmonary:     Effort: Pulmonary effort is normal.  Neurological:     Mental Status: He is alert. Mental status is at baseline.      Review of Systems  Constitutional: Negative.   HENT: Negative.   Eyes: Negative.   Respiratory: Negative.   Cardiovascular: Negative.   Gastrointestinal: Negative.   Genitourinary: Negative.   Musculoskeletal: Negative.   Skin: Negative.   Neurological: Negative.   Psychiatric/Behavioral: Positive for behavioral problems.    Blood pressure (!) 143/86, pulse 99, temperature 98 F (36.7 C), temperature source Oral, resp. rate 18, SpO2 100 %.There is no height or weight on file to calculate BMI.  General Appearance: Fairly Groomed  Eye Contact:  Minimal  Speech:  Slow  Volume:  Normal  Mood:  Euthymic  Affect:  Blunt  Thought Process:  Goal Directed  Orientation:  Other:  Patient oriented to self only, appears at baseline  Thought Content:  Unable to assess  Suicidal Thoughts:  No  Homicidal Thoughts:  No  Memory:  Unable to assess  Judgement:  Impaired  Insight:  Lacking  Psychomotor Activity:  Normal  Concentration:  Concentration: Fair  Recall:  Poor  Fund of Knowledge:  Poor  Language:  Poor  Akathisia:  No  Handed:  Right  AIMS (if indicated):     Assets:  Financial Resources/Insurance Housing Physical Health Resilience Social Support  ADL's: Unable to assess  Cognition:  Impaired,  Moderate  Sleep:         Treatment Plan Summary:  Patient reviewed with Dr. Lucianne Muss.  Patient does not require inpatient psychiatric treatment at this time. Patient's AFL will not allow patient to return.  Patient to remain in emergency department while alternative placement is secured by social work.  Patient's behavior appears improved with addition of Zyprexa 5 mg twice daily on yesterday. Social work will continue to seek placement.    Patrcia Dolly, FNP 06/25/2020, 10:37 AM

## 2020-06-25 NOTE — Social Work (Signed)
TOC CSW received a call from [facility], Elmore Guise [phone].  Marchelle Folks wanted to check in on pt and see how he was doing.  Amanda's updated CSW on her search for locating housing for pt.  Marchelle Folks or her team has not had any success.  CSW will continue to follow for dc needs.  Dandrae Kustra Tarpley-Carter, MSW, LCSW-A [name] ED Transitions of Education administrator Health [phone]

## 2020-06-25 NOTE — Progress Notes (Signed)
CSW requested and received verbal permission form the pt's legal guardian's supervisor Sander Radon with DSS as the pt's assigned LG [name] is out of the office today.  CSW sent pt's referral to the [facility] in care of the manager Oralia Rud at:  ph: [phone]  [email or web address]  CSW will continue to follow for D/C needs.  Dorothe Pea. Alphonzo Devera  MSW, LCSW, LCAS, CCS Transitions of Care Clinical Social Worker Care Coordination Department Ph: [phone]

## 2020-06-25 NOTE — ED Notes (Signed)
Pt has been cooperative today. Staying in his room. Likes to keep his room very neat.

## 2020-06-26 DIAGNOSIS — R456 Violent behavior: Secondary | ICD-10-CM | POA: Diagnosis not present

## 2020-06-26 MED ORDER — STERILE WATER FOR INJECTION IJ SOLN
INTRAMUSCULAR | Status: AC
  Administered 2020-06-26: 2.1 mL
  Filled 2020-06-26: qty 10

## 2020-06-26 MED ORDER — ACETAMINOPHEN 325 MG PO TABS
650.0000 mg | ORAL_TABLET | Freq: Once | ORAL | Status: AC
  Administered 2020-06-26: 650 mg via ORAL
  Filled 2020-06-26: qty 2

## 2020-06-26 NOTE — Progress Notes (Signed)
CSW called Burnadette Peter Arieegbe of the [facility] at ph: [phone] and left a HIPPA-compliant VM asking if they have any open IDD beds and to please call the [facility] CSW at ph: [phone] with their bed status.  CSW called Dr. Nicholes Calamity at ph: [phone] who is an owner at the [facility] and left a HIPPA-compliant VM asking if they have any open IDD beds and to please call the [facility] CSW at ph: [phone] with their bed status.  CSW will continue to follow for D/C needs.  Dorothe Pea. Tanysha Quant  MSW, LCSW, LCAS, CCS Transitions of Care Clinical Social Worker Care Coordination Department Ph: [phone]

## 2020-06-26 NOTE — Progress Notes (Signed)
CSW called Burnadette Peter Arieegbe of the [facility] at ph: [phone] and left a HIPPA-compliant VM asking if they have any open IDD beds and to please call the [facility] CSW at ph: [phone] with their bed status.  CSW will continue to follow for D/C needs.  Dorothe Pea. Annise Boran  MSW, LCSW, LCAS, CCS Transitions of Care Clinical Social Worker Care Coordination Department Ph: [phone]

## 2020-06-27 DIAGNOSIS — R456 Violent behavior: Secondary | ICD-10-CM | POA: Diagnosis not present

## 2020-06-27 MED ORDER — STERILE WATER FOR INJECTION IJ SOLN
INTRAMUSCULAR | Status: AC
  Administered 2020-06-27: 1.2 mL
  Filled 2020-06-27: qty 10

## 2020-06-27 MED ORDER — ACETAMINOPHEN 325 MG PO TABS
650.0000 mg | ORAL_TABLET | Freq: Four times a day (QID) | ORAL | Status: DC | PRN
  Administered 2020-06-27 – 2020-08-09 (×17): 650 mg via ORAL
  Filled 2020-06-27 (×17): qty 2

## 2020-06-27 NOTE — ED Provider Notes (Signed)
Emergency Medicine Observation Re-evaluation Note  Jonathan Bird is a 29 y.o. male, seen on rounds today.  Pt initially presented to the ED for complaints of Aggressive Behavior Currently, the patient is requiring restraints as he was violent just prior to my rounding.  Patient deems a wall, computer. Behavioral health nurse practitioner has been consulted for medication recommendations.  Physical Exam  BP 128/86 (BP Location: Left Arm)   Pulse (!) 106   Temp 97.8 F (36.6 C) (Oral)   Resp 19   SpO2 98%  Physical Exam Vitals and nursing note reviewed.  Constitutional:      Comments: Thin adult male in restraints, four-point, breathing easily.  HENT:     Head: Atraumatic.  Pulmonary:     Effort: No respiratory distress.     Breath sounds: No stridor.  Musculoskeletal:        General: No deformity.  Skin:    Comments: No gross wounds  Neurological:     Mental Status: He is alert.     Comments: No obvious deficit, patient moves all extremities spontaneously, wrestling against the restraints.  Psychiatric:        Mood and Affect: Affect is labile.        Behavior: Behavior is agitated and aggressive.     ED Course / MDM  EKG:EKG Interpretation  Date/Time:  Thursday June 24 2020 19:27:02 EDT Ventricular Rate:  82 PR Interval:  144 QRS Duration: 98 QT Interval:  358 QTC Calculation: 418 R Axis:   86 Text Interpretation: Sinus rhythm with marked sinus arrhythmia Incomplete right bundle branch block Borderline ECG Confirmed by Margarita Grizzle [phone]) on 06/26/2020 10:47:38 AM    I have reviewed the labs performed to date as well as medications administered while in observation.  Recent changes in the last 24 hours include new need for restraints. Plan  Current plan is for placement. Patient is under full IVC at this time.   Gerhard Munch, MD 06/27/20 1028

## 2020-06-27 NOTE — ED Notes (Signed)
Larence has been somewhat calm for the last thirty minutes.  He continuously says that he is sorry and he agrees that he "will be good"  He needed to use the bathroom so we released him from his restraints.  He is calm and cooperative at this time.

## 2020-06-27 NOTE — ED Notes (Signed)
Pt was pacing the room and banging the table against the wall damaging it. Security assisted in a show of force to administer Geodon. No other actions. cont to observe.

## 2020-06-27 NOTE — Progress Notes (Signed)
06/27/2020  1010  Patient came out of room saying "wet shirt" and pointing towards the lockers. Ask patient if he wanted new clothes and he said "no, wet shirt" and continued to point towards the locker. I told the patient no. And he banged his fist on the nurses station desk and laid in the floor. I told him to get off the floor and go into his room. Patient got up and walked into his room where he then began to punch the wall, push his side table into the wall, laid on the floor kicking a damaged the computer keyboard stand. Patient was kicking and spitting at staff. PRN meds given and patient was placed in hard restraints.

## 2020-06-28 DIAGNOSIS — R456 Violent behavior: Secondary | ICD-10-CM | POA: Diagnosis not present

## 2020-06-28 NOTE — Social Work (Signed)
TOC CSW received a call from Dr. Nicholes Calamity at ph: [phone] who is an owner at the [facility] Arts administrator).  Dr. Nicholes Calamity requested psych assessment and [facility] faxed to [phone] for review.  CSW will continue to follow for dc needs.  Jearl Soto Tarpley-Carter, MSW, LCSW-A [name] ED Transitions of Care Clinical Social Worker [facility] [phone]

## 2020-06-28 NOTE — Progress Notes (Addendum)
CSW received a call from Alden Hipp at ph: [phone] who is Care Coordinator for [facility] who is working to place pt and they are awaiting word from [facility] (residential living for IDD/autistic populations) and they feel this would be the best option for the pt and Miss [name] who is working with the pt's [facility] Start coordinator to work with the pt now for placement.  Per Miss [facility] and other professionals are working to identify pt's triggers as pt can be stable for great lengths of time but has unidentified triggers that his care team is working to identify as well as and any other possible unidentified mental health diagnoses.  CSW impressed upon the Care Coordinator the extremely pressing need for pt to be placed as pt canot remain in the ED and placement will need to take place immediately and if no word is forthcoming from [facility] placement will have to take place at a more immediate safe discharge site, such as a group home or temporary option equipped to care for the pt.  Care Coordinator Miss [name] voiced understanding is in contact with pt's Legal Guardian and is in contact with another provider [employer] [facility] Mgr Hyman Bible, who is interested in the pt's referral and who is considering doing a tele-assessment with the pt.  Miss [name] will contact Miss Daphine Deutscher and email the [facility] ED [facility] team with an update on this tele-assessment opportunity.  7:37 PM CSW received an email about the above-mentioned Miss Daphine Deutscher with the [employer] being open to complete a tele-assessment with the pt via Zoom.  2nd shift ED CSW will leave handoff for 1st shift ED CSW.  CSW will continue to follow for D/C needs.  Dorothe Pea. Shyquan Stallbaumer  MSW, LCSW, LCAS, CCS Transitions of Care Clinical Social Worker Care Coordination Department Ph: [phone]

## 2020-06-28 NOTE — ED Notes (Addendum)
Pt has been calm and cooperative today. Attends to his ADLs. Keeps his room neat.

## 2020-06-29 DIAGNOSIS — R456 Violent behavior: Secondary | ICD-10-CM | POA: Diagnosis not present

## 2020-06-29 NOTE — ED Provider Notes (Signed)
Emergency Medicine Observation Re-evaluation Note  Jonathan Bird is a 29 y.o. male, seen on rounds today.  Pt initially presented to the ED for complaints of Aggressive Behavior Currently, the patient is IVC'd due to aggressive behavior/outbursts. Psychiatrically clear, awaiting SW placement.  Physical Exam  BP (!) 138/98 (BP Location: Left Arm)   Pulse 89   Temp 98.7 F (37.1 C) (Oral)   Resp 18   SpO2 98%  Physical Exam Vitals and nursing note reviewed.  Constitutional:      Comments: Thin adult male Walking in hallway, smiling and happy. HENT:     Head: Atraumatic.  Pulmonary:     Effort: No respiratory distress.  Musculoskeletal:        General: No deformity.  Skin:    Comments: No gross wounds  Neurological:     Mental Status: He is alert. Normal gait     Comments: No obvious deficit, patient moves all extremities spontaneously. Psychiatric:    happy, cooperative.  ED Course / MDM  EKG:EKG Interpretation  Date/Time:  Thursday June 24 2020 19:27:02 EDT Ventricular Rate:  82 PR Interval:  144 QRS Duration: 98 QT Interval:  358 QTC Calculation: 418 R Axis:   86 Text Interpretation: Sinus rhythm with marked sinus arrhythmia Incomplete right bundle branch block Borderline ECG Confirmed by Margarita Grizzle [phone]) on 06/26/2020 10:47:38 AM    I have reviewed the labs performed to date as well as medications administered while in observation.  Plan  Current plan is for SW to identify group home placement. IVC papers expire today. I completed repeat IVC papers as patient has intermittently continued to have behavior outbursts requiring restraints and/or medications for staff safety. He lacks decision-making capacity.  Patient is under full IVC at this time.   Alexismarie Flaim, Ambrose Finland, MD 06/29/20 [phone]

## 2020-06-29 NOTE — ED Notes (Signed)
Patient was calm and cooperative today.  He took his medication without difficulty.  He has colored and napped all day.  Sitter is with him.

## 2020-06-30 DIAGNOSIS — R456 Violent behavior: Secondary | ICD-10-CM | POA: Diagnosis not present

## 2020-06-30 MED ORDER — STERILE WATER FOR INJECTION IJ SOLN
INTRAMUSCULAR | Status: AC
  Administered 2020-06-30: 10 mL
  Filled 2020-06-30: qty 10

## 2020-06-30 NOTE — Progress Notes (Addendum)
2nd shift ED CSW received a handoff from the 1st shift [facility] ED CSW.   Per 1st shift, "Jonathan Bird, [employer] wants a Virtual Assessment [phone]"  CSW received a call from Miss [name] pt's Care Coordinator [facility].) states the following providers are willing to do a tele-assessment with the pt.  Hyman Bible [phone] with [employer]  Bayard Males at ph: [phone] [facility].  CSW will also follow up with [facility] who CSW did send a referral to last week.   CSW awaiting return call from [employer] [place] who  is supposed to call the C.C.back regarding pt;s   Pt's Care Coordinator will be available all day tomorrow all day Friday 9-11am 12 to noon  CSW will continue to follow for D/C needs.  Dorothe Pea. Kataleyah Carducci  MSW, LCSW, LCAS, CCS Transitions of Care Clinical Social Worker Care Coordination Department Ph: [phone]

## 2020-06-30 NOTE — ED Provider Notes (Signed)
Emergency Medicine Observation Re-evaluation Note  Jonathan Bird is a 29 y.o. male, seen on rounds today.  Pt initially presented to the ED for complaints of Aggressive Behavior Currently, the patient is under IVC (renewed 06/30/20). No acute events overnight. Pt happy and alert this morning on rounds.  Physical Exam  BP (!) 146/104 (BP Location: Left Arm)   Pulse (!) 102   Temp 97.9 F (36.6 C) (Oral)   Resp 20   SpO2 100%  Physical Exam Vitalsand nursing notereviewed.  Constitutional:  Comments:  Awake in bed, smiling and happy, immediately got up and walked to me HENT:  Head: Atraumatic.  Pulmonary:  Effort: No respiratory distress.  Musculoskeletal:  General: No deformity.  Skin: Comments: No rash Neurological:  Mental Status: He is alert. Normal gait  Comments: No obvious deficit, patient moves all extremities spontaneously. Psychiatric: happy, cooperative.  ED Course / MDM  EKG:EKG Interpretation  Date/Time:  Thursday June 24 2020 19:27:02 EDT Ventricular Rate:  82 PR Interval:  144 QRS Duration: 98 QT Interval:  358 QTC Calculation: 418 R Axis:   86 Text Interpretation: Sinus rhythm with marked sinus arrhythmia Incomplete right bundle branch block Borderline ECG Confirmed by Margarita Grizzle [phone]) on 06/26/2020 10:47:38 AM    I have reviewed the labs performed to date as well as medications administered while in observation.  Recent changes in the last 24 hours include NONE. Plan  Current plan is for SW to identify group home placement. Patient is under full IVC at this time.   Amira Podolak, Ambrose Finland, MD 06/30/20 [phone]

## 2020-06-30 NOTE — ED Notes (Signed)
Patient yelling in the room and banging the wall. Pt unable to console. Geodon Given. Will continue to monitor.

## 2020-06-30 NOTE — Progress Notes (Addendum)
CSW called Hyman Bible [phone] with [employer] who stated she would like to complete a tele-assessment with the pt   Miss Jonathan Bird stated we can either send her a zoom link or she can send [place] a zoom link to do a telehealth assessment with the pt.  Her email is:  1. Hyman Bible at [phone]   [email or web address]  Miss Jonathan Bird the pt's legal guardian is in agreement with the tele-assessment taking place and stated she should be available if her schedule allows, that [facility] can give her a call at:  2. [name] w/DSS at ph: [phone]  [email or web address]  Miss Jonathan Bird w/ Shelly Coss states it might be helpful if she is on the zoom call as the pt knows her, is very familiar with her and likes her.  Miss Jonathan Bird was updated and, can attend and she can be reached at:  3. Jonathan Bird w/[facility] at ph: [phone]  [email or web address]  CSW updated the [facility] Advanced Care Supervisor as to the proposed tele-assessment for 7/22/at noon.  The TOC workers on the floor tomorrow (7/22) who can assist with the Zoom call are the [facility] RN CM and the TOC CSW.  2nd shift ED CSW will leave handoff for 1st shift TOC ED CSW/RN CM  CSW will continue to follow for D/C needs.  Dorothe Pea. Takela Varden  MSW, LCSW, LCAS, CCS Transitions of Care Clinical Social Worker Care Coordination Department Ph: [phone]

## 2020-06-30 NOTE — Progress Notes (Addendum)
TOC CSW called [employer] at [phone] to request a virtual assessment with pt on Thursday 7/22 at 2pm.  Rosilyn's email is [email or web address].  6:34 PM CSW also called Bayard Males at ph: [phone] [employer] and requested a call back about a tele-health assessment.  2nd shift ED CSW will leave handoff for 1st shift ED CSW.  CSW will continue to follow for D/C needs.  Dorothe Pea. Gaby Harney  MSW, LCSW, LCAS, CCS Transitions of Care Clinical Social Worker Care Coordination Department Ph: [phone]

## 2020-06-30 NOTE — Social Work (Signed)
TOC CSW received a call from [employer], [facility] at [phone].  Rosilyn called to request a virtual assessment with pt.  Rosilyn's email [email or web address].  CSW will continue to follow for dc needs.  Ziah Turvey Tarpley-Carter, MSW, LCSW-A [name] ED Transitions of Education administrator Health [phone]

## 2020-07-01 DIAGNOSIS — R456 Violent behavior: Secondary | ICD-10-CM | POA: Diagnosis not present

## 2020-07-01 NOTE — Social Work (Signed)
@  10:00am CSW was involved with a tele-health assessment requested Sandrea Hammond at [phone], [employer].  This assessment also included [name] w/DSS at ph: [phone] and [name] w/[facility] at ph: [phone].   @12 :00pm CSW was involved with a tele-health assessment requested by [phone] with [employer]. This assessment also included [phone] w/DSS at ph: [phone] and [phone] w/[facility] at ph: [phone].   Both assessments were productive.  [employer] will come visit pt to get further acquainted with pt.   CSW will continue to follow for dc needs.  Isaac Lacson Tarpley-Carter, MSW, LCSW-A [phone] ED Transitions of [name] [phone]

## 2020-07-01 NOTE — ED Provider Notes (Signed)
Emergency Medicine Observation Re-evaluation Note  Jonathan Bird is a 29 y.o. male, seen on rounds today.  Pt initially presented to the ED for complaints of Aggressive Behavior Currently, the patient is currently awake and not having any complaints but did become agitated last night requiring geodon.  Physical Exam  BP (!) 139/95 (BP Location: Right Arm)   Pulse 90   Temp 97.6 F (36.4 C) (Oral)   Resp 18   SpO2 99%  Physical Exam NAD Hr: RRR REsp: no tachypnea or resp distress Calm and cooperative at this time ED Course / MDM  EKG:EKG Interpretation  Date/Time:  Thursday June 24 2020 19:27:02 EDT Ventricular Rate:  82 PR Interval:  144 QRS Duration: 98 QT Interval:  358 QTC Calculation: 418 R Axis:   86 Text Interpretation: Sinus rhythm with marked sinus arrhythmia Incomplete right bundle branch block Borderline ECG Confirmed by Margarita Grizzle [phone]) on 06/26/2020 10:47:38 AM    I have reviewed the labs performed to date as well as medications administered while in observation.  Recent changes in the last 24 hours include none. Plan  Current plan is for needs inpt psych. Patient is under full IVC at this time.   Gwyneth Sprout, MD 07/01/20 1109

## 2020-07-01 NOTE — ED Notes (Signed)
Patient is resting comfortably. 

## 2020-07-02 DIAGNOSIS — R456 Violent behavior: Secondary | ICD-10-CM | POA: Diagnosis not present

## 2020-07-02 NOTE — Progress Notes (Signed)
TOC CM received update from pt's DSS Legal Guardian, [name] sent a plan for patient pending dc, TOC Director updated.   Plan:  Due to the violent and aggressive history of Jonathan Bird, [name] and [name] has requested to meet with Jonathan Bird in person to determine if he would be an appropriate fit for their group homes. Assistance from the Social Worker department is needed for the best option to help the agencies with their request from the team as it has been reported that face-to-face meeting is not safe for visitors or residents.     [name] and [facility] are the top prospects of placement that is willing to meet with Jonathan Bird at this time. The agencies have not accepted him but they are the top options we have available at this time.   Per the request of the prospective placements (most request from [facility]), Guardianship Social Worker [name] and [facility] Coordinator [name] has arranged a care team meeting with Autism Society Teaching laboratory technician, Day Program, [employer]) and [employer]. This meeting will take place next week with [facility].   Also due to the increased behaviors of Jonathan Bird and the safety concerns displayed at the last group home and AFL Provider Staff, [name] and I will be placing a request to set up behavioral based training for the prospective Group Home staff in preparation of Jonathan Bird entrance into the group home. This will include a crisis plan for the staff as well.   Placement list:  [name] has been keeping a running list of placement options since February 2021 until now of all referrals that have been submitted on behalf of Jonathan Bird.   Please see and review the attachment for your reference.   Additional follow up will take place on Monday, July 05, 2020 with additional phone calls  If I could place a request on behalf of Jonathan Bird team  as well - for incidents of behavioral outbursts at the hospital, if it is possible to receive an update on the full incident. [name] and I are both keeping records of the behavioral outburst to advocate for enhanced services for Jonathan Bird. In addition, the placement agencies have asked myself and [name] the current behavioral outbursts or situations that have occurred at the hospital.     Placement Lists  Client: Jonathan Bird  July 2021 06/16/20: Trellis Moment with [facility]: Agreed to look at information and consider placement.  06/16/20: Theresia Lo at [facility] agreed to look at information and consider placement 06/16/20: [facility] declined the placement 06/16/20: [facility] [facility] declined the placement 06/21/20: [facility], only AFLs right now and not appropriate for client 06/25/20: Nathaniel Man application 06/29/20:  [facility] [phone] Spoke with Tedra Senegal and they are full with no availability.  [employer], [employer]. [phone]. Voicemail for Brynda Greathouse the referral coordinator was full and could not accept any new messages.  07/02/20 [facility], [place] [phone]: No ICF vacancy [facility] [phone] - LVM [facility]; [phone] L[facility] [phone] (call back on Monday)  February 2021 [facility]: Under consideration by intake team, stated they may be able to meet needs and will follow up [facility]: Stated they would consider the intake but also stated that they would only take him if he also transferred his  day support services [facility] - no vacancies [employer] and Supports- Unable to meet needs [facility] - Unable to meet needs [facility]: unable to meet needs [facility]: No vacancies [facility]: emergency application submitted 01/29/20 [facility]: Contacted  referral coordinator Responded 02/03/20 that they are unable to meet needs [facility]: Waitlist that is typically years long per coordinator [facility]: No male vacancies at this time [facility]: only AFLs available at this time which may not be ideal due to acuity of need 02/03/20 [facility] application sent to liaison [facility]; reviewing request sent 2/24 [facility]: setting up video-screening 2/24 [facility] has one co-ed vacancy. We need to email them at [email or web address] to send psych eval, med list, guardianship information and ISP. We have consent. Called 02/04/20 Carrie sent information 2/25 02/04/20 [employer] [facility] said they have a few vacancies, one which may be a good fit. Patty Turbyfill [email or web address] is the contact and she wants a psychological evaluation.  02/04/24 [facility] has one vacancy. We have consent. They need [place] to complete the ICF application on their website, [email or web address]. Rennie Natter is working on application 02/04/20 [facility] was not available - voicemail. 02/04/20 [facility] number was disconnected. 02/04/20 [facility] rang with no voicemail. 02/04/20 [facility] wanted a psychological evaluation. We have consent. I sent to Penelope Galas at [email or web address]. 02/04/20 [facility]. I left a voicemail. 02/04/20 [employer] had no vacancies. 02/05/20 [employer] Application sent 02/05/20 [facility] - called and phone rang repeatedly, no voicemail/answering machine 02/05/20 Jobie Quaker - Contacted residential director 02/06/20: [facility] does not have any vacancies at this time. 02/06/20 [facility] does not have any vacancies at this time. They have a waitlist of 5-6 people that could take 2-3 years. 02/06/20 I tried to call [facility]. N/A. VM. 02/06/20  [facility] does not have any vacancies at this time. Their corporate office, [employer], does have a male vacancy in [place]. Current residents are age 82-50+. They emailed me an application, but we need consent. 02/06/20 I tried to call [facility] but got no answer.  02/06/20 I called [facility] and they had no voicemail set-up. 02/06/20 [facility], but their number was a fax number.  02/06/20 [facility] has no vacancies at this time.  02/06/20 [facility] has vacancies and when we have a consent, we can email [email or web address]. 02/06/20 [facility] has no vacancies at this time. Their corporate office has 2 vacancies. One is a 15 bed in [facility] that already has several people with intense behaviors, so it would likely not be a fit. The other is a 6-bed facility in [facility] and they need a psych/other docs sent to [email or web address] when we have a consent

## 2020-07-02 NOTE — Social Work (Signed)
CSW reached out to [employer] w/[facility] at [phone].  Both [employer] and [employer] are both interested in pt.  They both want to began visiting pt to make sure he's a good fit for their assistance and to gain acquaintance with each other.  CSW reached out to [name] w/DSS at [phone].  Aram Beecham stated that she would compile a list of places that both she and [name] had reached out to so that we will all be updated and on the same page while seeking placement for pt.  CSW will continue to follow for dc needs.  Ariani Seier Tarpley-Carter, MSW, LCSW-A [name] ED Transitions of Education administrator Health [phone]

## 2020-07-03 DIAGNOSIS — R456 Violent behavior: Secondary | ICD-10-CM | POA: Diagnosis not present

## 2020-07-03 MED ORDER — STERILE WATER FOR INJECTION IJ SOLN
INTRAMUSCULAR | Status: AC
  Filled 2020-07-03: qty 10

## 2020-07-03 NOTE — ED Notes (Signed)
Pt resting comfortably. Rise and fall of chest observed. Will get vital signs when pt wakes.Will continue to monitor

## 2020-07-03 NOTE — ED Notes (Signed)
Pt resting at this time. Rise and fall of chest observed. Sitter remains at bedside

## 2020-07-03 NOTE — ED Notes (Signed)
During shift change pt became very agitated due to increased stimulation surrounding the changing shifts. Pts needs were assessed and were all met concerning food, sleep, and restroom. Pt was also redirected to try to color, and play with his stimulation toys. However pts mental state continued to decompose. Pt began placing himself on the ground kicking the wall, bedside table, and chair. Pt then began slapping toward staff. Pt was restrained by staff and Geodon was given. Pt became calm momentarily but exited the bed and began trying to hit at staff again. Once again pt was restrained by staff and restraints were applied. Pt was then moved back to TCU to remove excess stimulation. Pt immediately became compliant and calmed down. Restraints were again removed before an order could be obtained. Pt does not show any signs of bodily harm/damage from application of restraints. Pt resting at this time.

## 2020-07-03 NOTE — ED Notes (Signed)
Pt becoming increasingly agitated with sitter, making threatening movements. Security at bedside.

## 2020-07-03 NOTE — ED Notes (Signed)
Pt got up and went to the bathroom. Pt is calm at this time and allowed [place] to gather vitals.

## 2020-07-03 NOTE — ED Provider Notes (Signed)
Patient currently stable.  He is awaiting placement, by social work.   Mancel Bale, MD 07/03/20 1019

## 2020-07-04 DIAGNOSIS — R456 Violent behavior: Secondary | ICD-10-CM | POA: Diagnosis not present

## 2020-07-04 MED ORDER — STERILE WATER FOR INJECTION IJ SOLN
INTRAMUSCULAR | Status: AC
  Filled 2020-07-04: qty 10

## 2020-07-04 NOTE — ED Notes (Signed)
Pt has been cooperative today.  

## 2020-07-04 NOTE — ED Notes (Signed)
Pt has been fine all day but at change of shift he escalated and started banging on things in his room yelling, and hitting the window to his door so hard that it rattled as if it could break.

## 2020-07-04 NOTE — ED Notes (Signed)
Pt allowed injection to be given without hands on. Restraints were not needed at this time.

## 2020-07-04 NOTE — ED Notes (Signed)
Pt has been cooperative today.

## 2020-07-05 DIAGNOSIS — R456 Violent behavior: Secondary | ICD-10-CM | POA: Diagnosis not present

## 2020-07-05 NOTE — ED Notes (Signed)
Patient resting quietly in bed.

## 2020-07-05 NOTE — ED Notes (Signed)
Patient getting agitated.  Warm blanket given by sitter.

## 2020-07-05 NOTE — ED Notes (Signed)
Patient resting quietly.

## 2020-07-05 NOTE — ED Notes (Signed)
Patient stated "Jonathan Bird".

## 2020-07-05 NOTE — ED Notes (Signed)
Pt has slept almost this writer's entire shift.  Pt briefly got up, walked into hallway, spoke to sitter, and went back into room.  Pt calm and pleasant.

## 2020-07-05 NOTE — Progress Notes (Signed)
TOC CM received call from [name], [facility] Acute Care Coordinator. States they continue to seek a group home for pt, they want a safe discharge due to his behaviors in the past has been difficult to place. She did follow up with [facility] and [employer]. Contacted Bayard Males, [facility] [address] [phone], and Hyman Bible, [facility] [place], [place] [phone]. States she will give me a call back on tomorrow, 07/06/2020. TOC CM/CSW will continue to follow for placement. Isidoro Donning RN CCM, [facility] ED TOC CM [phone]

## 2020-07-05 NOTE — ED Notes (Signed)
Snack of graham crackers/ saltine crackers/ peanut butter.

## 2020-07-05 NOTE — ED Notes (Signed)
Patient stated he wanted the sitter to pull her hair up.  Patient stated his head hurt.

## 2020-07-05 NOTE — ED Notes (Signed)
Patient resting and calm.

## 2020-07-05 NOTE — ED Notes (Signed)
Patient has been opening door and having brief blurts of positive emotion " stated (Good Job!)

## 2020-07-05 NOTE — ED Notes (Signed)
Patient resting quietly in room.  

## 2020-07-05 NOTE — ED Notes (Signed)
Pt has been cooperative and friendly throughout shift.

## 2020-07-05 NOTE — ED Notes (Signed)
Patient awake.  Gave linens to sitter.  Patient pleasant and smiling.

## 2020-07-05 NOTE — ED Notes (Signed)
Patient blurted out at door that "Richardson Dopp is going to church".

## 2020-07-06 DIAGNOSIS — R456 Violent behavior: Secondary | ICD-10-CM | POA: Diagnosis not present

## 2020-07-06 MED ORDER — STERILE WATER FOR INJECTION IJ SOLN
INTRAMUSCULAR | Status: AC
  Filled 2020-07-06: qty 10

## 2020-07-06 MED ORDER — IBUPROFEN 200 MG PO TABS
600.0000 mg | ORAL_TABLET | Freq: Four times a day (QID) | ORAL | Status: DC | PRN
  Administered 2020-07-06 – 2020-07-21 (×9): 600 mg via ORAL
  Filled 2020-07-06 (×9): qty 3

## 2020-07-06 NOTE — BH Assessment (Signed)
[facility] Assessment Progress Note  Pt's IVC expires today, but EDP Gwyneth Sprout, MD  finds that pt continues to meet criteria for IVC, which she has initiated.  IVC documents have been faxed to [facility], and at 15:18 Hortense Ramal confirms receipt.  He has since faxed Findings and Custody Order to this Clinical research associate.  At 15:30 this Clinical research associate called [employer] and spoke to [employer], who took demographic information, agreeing to dispatch law enforcement to fill out Return of Service.  As of this writing arrival of law enforcement is pending.  Pt's nurse, Diane, has been notified, as has Insurance underwriter, CSW.  Doylene Canning, [place] Behavioral Health Coordinator [phone]

## 2020-07-06 NOTE — ED Notes (Signed)
Pt woke up and is currently eating lunch, calm.

## 2020-07-06 NOTE — ED Notes (Signed)
This morning after pt had breakfast he had difficulty setting down and escalated to the point that IM medication was given. Escalated looked like yelling out, dropping pills onto floor, spilling drink, slamming the door, hitting the television.  Very limited in-adequate response to verbal de-escalation. Pt allowed injection to be given. Hands were not required to be put on. Pt then asked for his medications so they were re-pulled and given. Pt now in bed resting.

## 2020-07-06 NOTE — ED Provider Notes (Signed)
Emergency Medicine Observation Re-evaluation Note  Jonathan Bird is a 29 y.o. male, seen on rounds today.  Pt initially presented to the ED for complaints of Aggressive Behavior Currently, the patient is currently awake and calm.  However earlier today patient required IM sedation to receive medications.  Patient's IVC is also up today but will need to be renewed because he still requiring forced medications .  Physical Exam  BP (!) 135/102 (BP Location: Right Arm)    Pulse 101    Temp 97.8 F (36.6 C) (Axillary)    Resp 20    Ht 5\' 10"  (1.778 m)    Wt 87.1 kg    SpO2 97%    BMI 27.55 kg/m  Physical Exam Calm and cooperative with no complaints at this time.  Respiratory regular rate without respiratory distress.  Heart rate with minimal tachycardia with some agitation. ED Course / MDM  EKG:EKG Interpretation  Date/Time:  Thursday June 24 2020 19:27:02 EDT Ventricular Rate:  82 PR Interval:  144 QRS Duration: 98 QT Interval:  358 QTC Calculation: 418 R Axis:   86 Text Interpretation: Sinus rhythm with marked sinus arrhythmia Incomplete right bundle branch block Borderline ECG Confirmed by 02-06-1971 [phone]) on 06/26/2020 10:47:38 AM    I have reviewed the labs performed to date as well as medications administered while in observation.  Recent changes in the last 24 hours include none. Plan  Current plan is for still currently looking for placement patient is not stable for discharge otherwise. Patient is under full IVC at this time.   06/28/2020, MD 07/06/20 1409

## 2020-07-07 DIAGNOSIS — R456 Violent behavior: Secondary | ICD-10-CM | POA: Diagnosis not present

## 2020-07-07 NOTE — Social Work (Signed)
CSW received email from [name] w/[facility] at [phone] about information needed.  CSW faxed information in regards to pt current stay to fax# [phone].    CSW will continue to follow for dc needs.  Hussain Maimone Tarpley-Carter, MSW, LCSW-A [name] ED Transitions of Education administrator Health [phone]

## 2020-07-07 NOTE — ED Notes (Signed)
Uneventful shift for patient, was having frequent outburst to start the shift, after night med admin patient, was calm and cooperative.

## 2020-07-07 NOTE — ED Notes (Signed)
Pt has been cooperative this shift. Pt provided breakfast and snacks.  Pt listened to music.

## 2020-07-07 NOTE — ED Provider Notes (Signed)
°  Physical Exam  BP (!) 145/98 (BP Location: Left Arm)    Pulse 94    Temp (!) 97.5 F (36.4 C) (Oral)    Resp 20    Ht 5\' 10"  (1.778 m)    Wt 87.1 kg    SpO2 100%    BMI 27.55 kg/m   Physical Exam  ED Course/Procedures     Procedures  MDM  Patient been more calm over the night.  Eating and cooperative during the day.  Still pending placement       , MD 07/07/20 1500

## 2020-07-08 DIAGNOSIS — R456 Violent behavior: Secondary | ICD-10-CM | POA: Diagnosis not present

## 2020-07-08 NOTE — ED Notes (Signed)
Pt alert this shift. Pt cooperative. No agitation. Pt medication compliant.

## 2020-07-08 NOTE — ED Notes (Signed)
Pt alert. Cooperative. No s/s of distress.

## 2020-07-08 NOTE — ED Provider Notes (Signed)
Emergency Medicine Observation Re-evaluation Note  Jonathan Bird is a 29 y.o. male, seen on rounds today.  Pt initially presented to the ED for complaints of Aggressive Behavior Currently, the patient is resting comfortably with no complaints.  Physical Exam  BP (!) 135/105 (BP Location: Left Arm)   Pulse 98   Temp 98.5 F (36.9 C) (Oral)   Resp 18   Ht 5\' 10"  (1.778 m)   Wt 87.1 kg   SpO2 99%   BMI 27.55 kg/m  Physical Exam  Patient had clear breath sounds and nontender chest and abdomen.  Patient is resting comfortably and reported no difficulty with a bowel movement.  ED Course / MDM  EKG:EKG Interpretation  Date/Time:  Thursday June 24 2020 19:27:02 EDT Ventricular Rate:  82 PR Interval:  144 QRS Duration: 98 QT Interval:  358 QTC Calculation: 418 R Axis:   86 Text Interpretation: Sinus rhythm with marked sinus arrhythmia Incomplete right bundle branch block Borderline ECG Confirmed by 02-06-1971 [phone]) on 06/26/2020 10:47:38 AM    I have reviewed the labs performed to date as well as medications administered while in observation.  Recent changes in the last 24 hours include none . Plan  Current plan is still pending placement. Patient is under full IVC at this time.   Ashunti Schofield, 06/28/2020, MD 07/08/20 [phone]

## 2020-07-09 DIAGNOSIS — R456 Violent behavior: Secondary | ICD-10-CM | POA: Diagnosis not present

## 2020-07-09 NOTE — ED Notes (Signed)
Pt had shower and hair cut this morning. Pt seems to be in a good mood.

## 2020-07-09 NOTE — ED Notes (Signed)
Uneventful night for patient, calm and cooperative, all meds given, patient slept through entire night, no disruptions.

## 2020-07-09 NOTE — Social Work (Signed)
CSW sent email to update care coordinating team for pt.  To gain an update of their progress with placement.  CSW received several correspondences in regards to no progress with placement.  CSW informed care coordinating team that finding placement for pt is imperative to his well-being, due to low activity and interaction with peers.  CSW will continue to follow for dc needs.  Persis Graffius Tarpley-Carter, MSW, LCSW-A                  [name] ED Transitions of CareClinical Social Worker [email or web address] [phone]

## 2020-07-10 DIAGNOSIS — R456 Violent behavior: Secondary | ICD-10-CM | POA: Diagnosis not present

## 2020-07-10 MED ORDER — STERILE WATER FOR INJECTION IJ SOLN
INTRAMUSCULAR | Status: AC
  Filled 2020-07-10: qty 10

## 2020-07-10 NOTE — ED Notes (Signed)
Pt is agitated today.  Yelling and hitting bed.

## 2020-07-11 DIAGNOSIS — R456 Violent behavior: Secondary | ICD-10-CM | POA: Diagnosis not present

## 2020-07-12 DIAGNOSIS — R456 Violent behavior: Secondary | ICD-10-CM | POA: Diagnosis not present

## 2020-07-12 MED ORDER — STERILE WATER FOR INJECTION IJ SOLN
INTRAMUSCULAR | Status: AC
  Filled 2020-07-12: qty 10

## 2020-07-12 NOTE — ED Provider Notes (Signed)
Emergency Medicine Observation Re-evaluation Note  Jonathan Bird is a 29 y.o. male, seen on rounds today.  Pt initially presented to the ED for complaints of Aggressive Behavior Currently, the patient is awaiting Group Home placement  Physical Exam  BP (!) 141/99 (BP Location: Left Arm)   Pulse 74   Temp 97.7 F (36.5 C) (Oral)   Resp 18   Ht 5\' 10"  (1.778 m)   Wt 87.1 kg   SpO2 99%   BMI 27.55 kg/m  Physical Exam Resting comfortably ED Course / MDM  EKG:EKG Interpretation  Date/Time:  Thursday June 24 2020 19:27:02 EDT Ventricular Rate:  82 PR Interval:  144 QRS Duration: 98 QT Interval:  358 QTC Calculation: 418 R Axis:   86 Text Interpretation: Sinus rhythm with marked sinus arrhythmia Incomplete right bundle branch block Borderline ECG Confirmed by 02-06-1971 [phone]) on 06/26/2020 10:47:38 AM    I have reviewed the labs performed to date as well as medications administered while in observation.  Recent changes in the last 24 hours include none Plan  Current plan is for Group Home placement. Patient is under full IVC at this time.   06/28/2020, MD 07/12/20 [phone]

## 2020-07-12 NOTE — ED Notes (Signed)
At this time Jonathan Bird appears upset after listening to upbeat music that got him worked up, but was redirectable by staff. Jonathan Bird attempted to shut the door to his room and became upset when staff informed him that his door had to stay open at all time Jonathan Bird became very loud with yelling about closing the door to his room. Loss and reward system utilized and was moderately effective.

## 2020-07-13 DIAGNOSIS — R456 Violent behavior: Secondary | ICD-10-CM | POA: Diagnosis not present

## 2020-07-13 MED ORDER — CHLORPROMAZINE HCL 25 MG PO TABS
ORAL_TABLET | ORAL | Status: AC
  Filled 2020-07-13: qty 7

## 2020-07-13 NOTE — ED Provider Notes (Signed)
Emergency Medicine Observation Re-evaluation Note  DIONTA LARKE is a 29 y.o. male, seen on rounds today.  Pt initially presented to the ED for complaints of Aggressive Behavior Currently, the patient is awaiting placement in Group Home  Physical Exam  BP (!) 140/103 (BP Location: Left Arm)   Pulse 100   Temp 97.8 F (36.6 C) (Oral)   Resp 17   Ht 5\' 10"  (1.778 m)   Wt 87.1 kg   SpO2 95%   BMI 27.55 kg/m  Physical Exam Resting comfortably, no distress ED Course / MDM  EKG:EKG Interpretation  Date/Time:  Thursday June 24 2020 19:27:02 EDT Ventricular Rate:  82 PR Interval:  144 QRS Duration: 98 QT Interval:  358 QTC Calculation: 418 R Axis:   86 Text Interpretation: Sinus rhythm with marked sinus arrhythmia Incomplete right bundle branch block Borderline ECG Confirmed by 02-06-1971 [phone]) on 06/26/2020 10:47:38 AM    I have reviewed the labs performed to date as well as medications administered while in observation.  Recent changes in the last 24 hours include none. Plan  Current plan is for placement. Patient is under full IVC at this time.   06/28/2020, MD 07/13/20 [phone]

## 2020-07-13 NOTE — Social Work (Signed)
TOC CSW connected with pts care coordination team and ED team for a team meeting to discuss the plans of pts placement.  The expectations of these meetings are to gain regular updates about pts placement progress, pts care, and how ED team can assist the care coordiation team with placement of pt.  Pts overall safety and care is the priority of all and the outcome to obtain placement for pt.  CSW will continue to follow for D/C needs.  Saveah Bahar Tarpley-Carter, MSW, LCSW-A                  [name] ED Transitions of CareClinical Social Worker [email or web address] [phone]

## 2020-07-14 DIAGNOSIS — R456 Violent behavior: Secondary | ICD-10-CM | POA: Diagnosis not present

## 2020-07-14 MED ORDER — STERILE WATER FOR INJECTION IJ SOLN
INTRAMUSCULAR | Status: AC
  Administered 2020-07-14: 10 mL
  Filled 2020-07-14: qty 10

## 2020-07-14 NOTE — ED Notes (Signed)
Pt is agitated and restless. Geodon 20MG  IM Given. WIll continue to monitor.

## 2020-07-15 DIAGNOSIS — R456 Violent behavior: Secondary | ICD-10-CM | POA: Diagnosis not present

## 2020-07-15 MED ORDER — STERILE WATER FOR INJECTION IJ SOLN
INTRAMUSCULAR | Status: AC
  Filled 2020-07-15: qty 10

## 2020-07-15 NOTE — ED Notes (Signed)
Pt.s breakfast has arrived, pt sitting on his bed and eating his breakfast. Will continue to monitor pt.

## 2020-07-15 NOTE — ED Notes (Signed)
Pt was cooperative during the entire time. Pt would occassionally have very small and redirectable outbursts. Pt would color most of the day or nap. Pt would also talk to this sitter and another coworker. Pt has been cooperative this time. Will continue to monitor pt.

## 2020-07-15 NOTE — ED Notes (Signed)
Patient had an out burst today in which he threw objects in the room and banged on the table while yelling. Patient appeared to calm down for a few moments, but then began to yell and bang the table again. Patient given Geodon.

## 2020-07-15 NOTE — ED Notes (Signed)
Pt's dinner has arrived, pt sitting up and eating his dinner now 

## 2020-07-15 NOTE — ED Notes (Signed)
Patient went for a walk with security and two staff members.

## 2020-07-15 NOTE — ED Notes (Signed)
Pt was coloring when all of a sudden pt had threw a crayon across his room, outside of the room. Pt then started screaming, kicking and biting himself. Staff members in the TCU attempted to calm him down by using breathing therapy- by breathing to 10. Pt did so, sat down and relaxed for a second. Pt slammed his hand on his desk then yelled once again. Pt was told to calm down, and breathe normally. Pt now sitting on his bed. Will continue to monitor pt.

## 2020-07-15 NOTE — ED Notes (Signed)
Pt.s lunch tray has arrived, pt awoken for vitals and lunch. Pt now sitting up and eating his lunch. Will continue to monitor pt.

## 2020-07-15 NOTE — ED Provider Notes (Signed)
Emergency Medicine Observation Re-evaluation Note  Jonathan Bird is a 29 y.o. male, seen on rounds today.  Pt initially presented to the ED for complaints of Aggressive Behavior Currently, the patient is calm, alert, normal vitals.   Physical Exam  BP (!) 123/91 (BP Location: Left Arm)   Pulse (!) 109   Temp 98.1 F (36.7 C) (Oral)   Resp 18   Ht 1.778 m (5\' 10" )   Wt 87.1 kg   SpO2 100%   BMI 27.55 kg/m  Physical Exam  Gen: sitting up in bed, calm/alert. Psych: no current agitation or aggressive behavior. Calm alert.   ED Course / MDM  EKG:EKG Interpretation  Date/Time:  Thursday June 24 2020 19:27:02 EDT Ventricular Rate:  82 PR Interval:  144 QRS Duration: 98 QT Interval:  358 QTC Calculation: 418 R Axis:   86 Text Interpretation: Sinus rhythm with marked sinus arrhythmia Incomplete right bundle branch block Borderline ECG Confirmed by 02-06-1971 [phone]) on 06/26/2020 10:47:38 AM    I have reviewed the labs performed to date as well as medications administered while in observation.  Recent changes in the last 24 hours include TOC team working on placement.  Plan  Current plan is for placement of patient.    06/28/2020, MD 07/15/20 1311

## 2020-07-15 NOTE — ED Notes (Signed)
Pt awake, early in the morning. Gave patient 5 different crayons and 2 sheets of paper. Pt colored and asked to have them hanged on his wall. This writer helped pt apply them on his wall with tape. Pt then asked for "french toast, good job. [place] toast". The other staff members stated that patient would like to eat french toast this morning, per night shift RN it was fine to order his meals. All 3 meals ordered. Will continue to monitor pt.

## 2020-07-15 NOTE — ED Notes (Signed)
Pt went for a walk with this Clinical research associate, his MHT and a security guard. Pt walked outside, stayed in arm lengths of everyone. Did well walking outside. Will continue to monitor pt.

## 2020-07-15 NOTE — Progress Notes (Addendum)
07/15/2020 2:59 pm Message received from Nada Libman, I/DD Care Coordinator Team Supervisor, [place]. The [facility] application was finalized and mailed yesterday. Will fax MAR as requested.  Jonnie Finner RN CCM, [facility] ED [facility] Jearld Lesch [phone]  07/12/2020 1130 am Message received from Nada Libman, I/DD Care Coordinator, See below  Nada Libman, [place], I/DD Care Coordination Team Supervisor I/DD [place] [address] [place], [place]  [address] [phone]  "The application for [facility] was submitted on 7/27. I left a voicemail with Delmer Islam this morning at 11am to determine a timeline for response. The information was sent to [facility] on 7/29. I spoke with Tena this morning and she informed me that they would have a team meeting today at 12pm where they would review JP's information.  [facility] has rejected JP as of 7/30. The information was sent to [facility] on 7/29. I left a message for [facility] staff at 11:20am today to determine a timeline for response.   [facility] and [facility] are requesting Enhanced Rates which are not available in JP's budget at this time, as he has already gone over the waiver limit. JP's new plan will start 9/1 at which time an enhanced rate could be requested."  "I apologize for the delay in my response. It's my understanding that [facility] is more of a back-up option, as they have a waiting list. Calla Kicks, care coordinator, submitted application and provided additional information requested on 7/29. I just spoke to our [employer] liaison who said that the application is being finalized and will be mailed to [place] today."   07/09/2020 1020 am Message received from Timoteo Gaul, [place] Legal guardian,  p [phone], m [phone], f [phone]  For future communication - please remove Wayna Chalet and Kennon Holter as they are not related to this case at all. They were/are assisting [facility] Aretha Parrot with a completely different client on my guardianship caseload.  Mr. Stailey care coordinator, Calla Kicks, continues to call ICF Placements on a statewide list. Morey Hummingbird and I have been working together to complete various referrals, consents, and information updates to various agencies as it relates to Mr. Elamin.   Information has been submitted to the following agencies who are considering Mr. Asare for placement: . [facility]: Application Submitted . [facility]: Information about Mr. Madewell submitted for their review . [facility]: Information about Mr. Fous[place] submitted for their review . [facility]: Information about Mr. Mellin submitted for their review . [facility]: Referral submitted but additional information was requested - additional information and documentation has been submitted for review  As stated from [place] in previous communication, [facility] is requesting certain services to be in place. We are receiving the same response from [facility] as well (minus the letter of support).   Also, Mr. Johsnon's Care Team that includes [facility], [facility], [place], [place] ([place] and [place]), and [facility]) meet as a group to brainstorm other alternatives to assist with this matter as well on Tuesday, July 06, 2020 via [employer].  Calla Kicks will be on vacation next week, so Nada Libman (her supervisor) will be the direct contact for the case. I will continue to work with Stanton Kidney and Morey Hummingbird to provide the support needed to find placement for Mr. Taney.  Morey Hummingbird, I believe I have capture most information but please add if I am missing anything.  07/07/2020 1244 pm  message sent from Calla Kicks, IDD Care Coordinator, see below  Calla Kicks BA, QP, Care Coordinator IDD/Care [place] [address] [place], [place]  [address] [phone]  fax   "Unfortunately, I do not know. I am submitting the application to [employer]. now that I have the information that you provided for me and sending this information to the [facility] liaison now as they have to do all the communication for that program.   [facility] provided the list of requirements for them to accept JP into the program and they are not requirements that can be met immediately such as an enhanced rate, the letter of support for licensure of the home and additional service hours to be provided. The letter of support has been requested but JP's budget cannot support the enhanced rate because it would put him outside the limitations of the [employer] waiver before his budget starts over 08/11/20.   I am continuing to call through every ICF in the state in an effort to secure the fastest safe placement I am able to.   I have copied my supervisor, Nada Libman on this email as I will be going out on Friday and will be out all next week. If we are not able to locate a safe discharge by that time she will continue to work on this in my absence. "

## 2020-07-16 DIAGNOSIS — R456 Violent behavior: Secondary | ICD-10-CM | POA: Diagnosis not present

## 2020-07-16 NOTE — ED Notes (Signed)
Pt is presently resting in room, No signs of distress. Sitter outside door. Continue to monitor.

## 2020-07-16 NOTE — Progress Notes (Signed)
TOC CM received updated message from pt's DSS Legal Guardian, [name]. See below "I spoke with Theresia Lo from [facility] (I realize that I have been saying the agency name wrong). She is planning to come to the hospital Monday to see JP. Due to past aggressive history with previous clients, Ms. Daphine Deutscher would like to make sure she gets an accurate account and picture of JP. I am not sure of the time but I informed her to contact the SW Team to touch base if possible. She's aware of visitation process.  Also, Blake Divine from [facility] called me but I missed her call. She left a message- JP's referral has been received and the team will begin the reviewing process."  TOC CM/CSW will assist [facility] with setting up a visit. [facility] Clinical Team updated. Isidoro Donning RN CCM, [facility] ED TOC CM [phone]

## 2020-07-16 NOTE — Progress Notes (Signed)
07/16/2020  1807  Notified MD of pt's BP 138/103. Waiting for response.

## 2020-07-17 DIAGNOSIS — R456 Violent behavior: Secondary | ICD-10-CM | POA: Diagnosis not present

## 2020-07-17 NOTE — ED Notes (Signed)
Uneventful shift for patient, remained calm most of night after taking all night meds, [facility] to come see Monday.

## 2020-07-17 NOTE — ED Notes (Signed)
Pt was calm and cooperative all day.

## 2020-07-17 NOTE — ED Provider Notes (Signed)
Emergency Medicine Observation Re-evaluation Note  Jonathan Bird is a 29 y.o. male, seen on rounds today.  Pt initially presented to the ED for complaints of Aggressive Behavior Currently, the patient is alert and calm.  Physical Exam  BP (!) 142/102 (BP Location: Left Arm)   Pulse 95   Temp 97.9 F (36.6 C) (Oral)   Resp 20   Ht 5\' 10"  (1.778 m)   Wt 87.1 kg   SpO2 100%   BMI 27.55 kg/m  Physical Exam No acute distress ED Course / MDM  EKG:EKG Interpretation  Date/Time:  Thursday June 24 2020 19:27:02 EDT Ventricular Rate:  82 PR Interval:  144 QRS Duration: 98 QT Interval:  358 QTC Calculation: 418 R Axis:   86 Text Interpretation: Sinus rhythm with marked sinus arrhythmia Incomplete right bundle branch block Borderline ECG Confirmed by 02-06-1971 [phone]) on 06/26/2020 10:47:38 AM    I have reviewed the labs performed to date as well as medications administered while in observation.  Recent changes in the last 24 hours include he has remained stable, awaiting visit from a group home provider.. Plan  Current plan is for placement in a group home. Patient is under full IVC at this time.   06/28/2020, MD 07/17/20 [phone]

## 2020-07-18 DIAGNOSIS — R456 Violent behavior: Secondary | ICD-10-CM | POA: Diagnosis not present

## 2020-07-19 DIAGNOSIS — R456 Violent behavior: Secondary | ICD-10-CM | POA: Diagnosis not present

## 2020-07-19 NOTE — ED Notes (Signed)
Pt pleasant, responds well to staff who are engaged with him. Attends to his ADLs and keeps his room very neat. This is very important to him. He is focused on his birthday being [birth year] and mentions it on and off everyday indicating that he is somewhat oriented to time.  He hopes to get gifts and staff are planning a cake and gifts.

## 2020-07-19 NOTE — Social Work (Addendum)
TOC CSW spoke with Diane,RN in TCU about pt receiving a visit fromTracy Martin/[facility], [phone] with    (prospective Group Home) today.  We agreed to keep each other updated on the visit.  CSW will continue to follow for dc needs.  Jonathan Bird, MSW, LCSW-A                  [name] ED Transitions of CareClinical Social Worker [email or web address] [phone]

## 2020-07-19 NOTE — ED Provider Notes (Signed)
Emergency Medicine Observation Re-evaluation Note  Jonathan Bird is a 29 y.o. male, seen on rounds today.  Pt initially presented to the ED for complaints of Aggressive Behavior Currently, the patient is awaiting placement.  Physical Exam  BP 131/78 (BP Location: Left Arm)   Pulse 96   Temp 97.9 F (36.6 C) (Oral)   Resp 18   Ht 1.778 m (5\' 10" )   Wt 87.1 kg   SpO2 98%   BMI 27.55 kg/m  Physical Exam Vitals and nursing note reviewed.  Constitutional:      General: He is not in acute distress.    Appearance: He is not ill-appearing, toxic-appearing or diaphoretic.  HENT:     Head: Normocephalic and atraumatic.     Nose: No rhinorrhea.  Cardiovascular:     Rate and Rhythm: Normal rate and regular rhythm.  Pulmonary:     Effort: Pulmonary effort is normal. No respiratory distress.  Abdominal:     General: There is no distension.  Musculoskeletal:        General: No swelling or deformity.  Psychiatric:        Mood and Affect: Mood normal.     Comments: Calm and cooperative     ED Course / MDM  EKG:EKG Interpretation  Date/Time:  Thursday June 24 2020 19:27:02 EDT Ventricular Rate:  82 PR Interval:  144 QRS Duration: 98 QT Interval:  358 QTC Calculation: 418 R Axis:   86 Text Interpretation: Sinus rhythm with marked sinus arrhythmia Incomplete right bundle branch block Borderline ECG Confirmed by 02-06-1971 [phone]) on 06/26/2020 10:47:38 AM    I have reviewed the labs performed to date as well as medications administered while in observation.  Patient has been calm these past 24 hours.  No acute interventions needed Plan  Current plan is for placement.  Case management involved in this process.    06/28/2020, MD 07/19/20 1005

## 2020-07-19 NOTE — ED Notes (Signed)
Pt has been sleeping after breakfast. Night shift said that he did not sleep well last night because of the noise in the ED.

## 2020-07-19 NOTE — ED Notes (Signed)
A group home representative did not visit or interview pt today. Pt has been very cooperative and pleasant all day.

## 2020-07-20 DIAGNOSIS — R456 Violent behavior: Secondary | ICD-10-CM | POA: Diagnosis not present

## 2020-07-20 NOTE — ED Provider Notes (Signed)
Patient awaiting group home placement.  He has history of intellectual disability and is currently under IVC as he becomes a danger to himself and others when agitated.  Patient's IVC forms renewed.  He is resting comfortably and in no distress.   Geoffery Lyons, MD 07/20/20 [phone]

## 2020-07-20 NOTE — Social Work (Signed)
TOC CSW received update via email from [employer], Care Coordinator, [place], [phone].  [name] stated that [facility] application had been completed and sent last week by [name], DSS/Legal Guardian.     [name] also stated that she has reached out to [facility] to do a screening.    CSW spoke with supervisor, Wandra Mannan about reaching out to the state for assistance with placement.  Contact person is Glendon Axe, Division of Mental Health/IDD.    CSW will continue to follow for dc needs.  Delrae Hagey Tarpley-Carter, MSW, LCSW-A                  [name] ED Transitions of CareClinical Social Worker [email or web address] [phone]

## 2020-07-20 NOTE — Progress Notes (Signed)
CSW received a call from Gordan Payment with [facility] who states that she will be arriving at the [facility] ED TCU to assess the pt between 5 pm-5:30 pm.  CSW counseled Miss Jonathan Bird to alert registration to her presence when she arrives and that the pt's ED TCU RN will be awaiting Miss Jonathan Bird at that time.  Registration and RN aware and are expecting Miss Jonathan Bird.  CSW will continue to follow for D/C needs.  Dorothe Pea. Manju Kulkarni  MSW, LCSW, LCAS, CCS Transitions of Care Clinical Social Worker Care Coordination Department Ph: [phone]

## 2020-07-20 NOTE — ED Notes (Signed)
Pt alert and cooperative this shift. Pt napped during the day.

## 2020-07-20 NOTE — BH Assessment (Signed)
[facility] Assessment Progress Note  Pt's IVC expires today.  EDP Geoffery Lyons, MD has initiated new IVC.  Ricquita Tarpley-Carter, CSW has been notified.  Doylene Canning, [place] Behavioral [facility] Coordinator [phone]

## 2020-07-21 DIAGNOSIS — R456 Violent behavior: Secondary | ICD-10-CM | POA: Diagnosis not present

## 2020-07-21 MED ORDER — STERILE WATER FOR INJECTION IJ SOLN
INTRAMUSCULAR | Status: AC
  Filled 2020-07-21: qty 10

## 2020-07-21 NOTE — Social Work (Signed)
TOC CSW reached out to [facility], [phone] in regards to her visit yesterday.  CSW left HIPPA compliant message with my contact information.  CSW will continue to follow for dc needs.  Arianne Klinge Tarpley-Carter, MSW, LCSW-A                  [name] ED Transitions of CareClinical Social Worker [email or web address] [phone]

## 2020-07-21 NOTE — Progress Notes (Addendum)
CSW received a call from Hyman Bible with [facility] who just arrived and assessed the pt.  Miss Daphine Deutscher was to arrive yesterday but had been unable to present to the ED due to last-minute needs from her group home that kept her there.  Miss Martic states the assessment went well and the pt presented very well.  Miss Daphine Deutscher would like a group home staff member who had formerly worked with the pt to come to the ED on 07/22/20 to agree the pt is now a good candidate for the [facility].  Miss Daphine Deutscher states she will contact the 1st shift [facility] ED [facility] ED CSW on 07/22/20, to set that up.  2nd shift ED CSW will leave handoff for 1st shift ED CSW.  CSW will continue to follow for D/C needs.  Dorothe Pea. Sanjna Haskew  MSW, LCSW, LCAS, CCS Transitions of Care Clinical Social Worker Care Coordination Department Ph: [phone]

## 2020-07-21 NOTE — ED Notes (Signed)
Patient has been agitated this am.  It started when he wanted "paper towel"  He started banging the wall in the bathroom.  After we figured out he wanted a washcloth he calmed down for a bit.  He the got in shower and got mad again and somehow broke the handle off of the shower causing the water to spray all over the room.  Pt was walked to hallway shower and prn was prepared.

## 2020-07-21 NOTE — ED Notes (Signed)
Lab draw and urine was charted in error.

## 2020-07-21 NOTE — ED Notes (Signed)
Uneventful shift for patient, took shower at start of shift and slept through the night, [facility] no show for evaluation.

## 2020-07-22 DIAGNOSIS — R456 Violent behavior: Secondary | ICD-10-CM | POA: Diagnosis not present

## 2020-07-22 MED ORDER — STERILE WATER FOR INJECTION IJ SOLN
INTRAMUSCULAR | Status: AC
  Filled 2020-07-22: qty 10

## 2020-07-22 NOTE — ED Notes (Signed)
Pt alert this shift. Pt napped during shift. Pt cooperative with care and redirection.

## 2020-07-22 NOTE — ED Notes (Signed)
Patient agitated this morning. Geodon 20mg  IM Given. Will  Continue to monitor.

## 2020-07-23 DIAGNOSIS — R456 Violent behavior: Secondary | ICD-10-CM | POA: Diagnosis not present

## 2020-07-23 NOTE — ED Notes (Signed)
Patient ate 100% of his lunch. Very excited about his upcoming birthday and enjoys planning ahead for the event!

## 2020-07-23 NOTE — ED Notes (Signed)
Patient keeps getting agitated and hitting bed and walls.  We have redirected him many times.

## 2020-07-23 NOTE — ED Notes (Signed)
Patient is getting a bit anxious and came out stating he needed to "calm down"

## 2020-07-23 NOTE — Social Work (Signed)
TOC CSW reached out to [facility], [phone] in regards to her visit yesterday.  [name] stated the visit was not with her, but with a staff member/[name].  When Minerva Areola came by pt was eating so he just observed his interaction with hospital staff, but will come by again to visit.  [name] also stated she will make more visits and also encourage her staff to visit as well.  These visits are to get familiar with Jonathan Bird in hopes that he gets familiar with them and build a rapport.  CSW will continue to follow for dc needs.  Allexa Acoff Tarpley-Carter, MSW, LCSW-A                  [name] ED Transitions of CareClinical Social Worker [email or web address] [phone]

## 2020-07-23 NOTE — ED Notes (Signed)
Patient stated his head is hurting to sitter and sitter reported to RN.

## 2020-07-23 NOTE — ED Notes (Signed)
Patient still sleeping/rise and fall of chest witnessed

## 2020-07-23 NOTE — Social Work (Signed)
TOC CSW spoke with [facility], [phone] in regards to a precise time of when he will be visited by herself and staff.  [name] stated she would bring a schedule on Monday of the prospective visits for next week.  CSW will continue to follow for dc needs.  Jonathan Bird, MSW, LCSW-A                  Jonathan Bird ED Transitions of CareClinical Social Worker [email or web address] [phone]

## 2020-07-23 NOTE — ED Notes (Signed)
Patient pulled off sheets and assisted with placing new sheets on bed.  Patient ate breakfast and threw away his breakfast tray.

## 2020-07-23 NOTE — ED Notes (Signed)
Patient stated he stunk and sitter gave patient clean towel/wash cloth/purple scrubs.

## 2020-07-23 NOTE — ED Notes (Signed)
Patient becomes agitated pulls off foot board from bed and flings himself to floor. RN told him to be good for his birthday party coming up. Reminded to "stay calm/be good"  Patient was given graham crackers and crayons and coloring pages to color.

## 2020-07-24 DIAGNOSIS — R456 Violent behavior: Secondary | ICD-10-CM | POA: Diagnosis not present

## 2020-07-24 MED ORDER — STERILE WATER FOR INJECTION IJ SOLN
INTRAMUSCULAR | Status: AC
  Administered 2020-07-24: 10 mL
  Filled 2020-07-24: qty 10

## 2020-07-24 NOTE — ED Notes (Signed)
Pt became agitated. Yelling, biting hand, rolling on floor, banging bed, slammed door, pt difficult to redirect.  PRN Geodon 20 mg IM given.

## 2020-07-24 NOTE — ED Provider Notes (Addendum)
Emergency Medicine Observation Re-evaluation Note  Jonathan Bird is a 29 y.o. male, seen on rounds today.  Pt initially presented to the ED for complaints of Aggressive Behavior Currently, the patient is awaiting disposition.  He is pleasant this morning performing morning hygiene, he denies any complaints.  Patient has been holding in the ED for 755 hours  Physical Exam  BP 137/89 (BP Location: Left Arm)   Pulse (!) 101   Temp 97.6 F (36.4 C) (Oral)   Resp 18   Ht 1.778 m (5\' 10" )   Wt 87.1 kg   SpO2 100%   BMI 27.55 kg/m  Physical Exam General: Well-nourished well-developed Respiratory: Breathing easily, no stridor Musculoskeletal, moving all extremities  Neuro: No facial droop, moving all extremities without difficulty, normal gait Psych: Calm, cooperative ED Course / MDM  EKG:EKG Interpretation  Date/Time:  Thursday June 24 2020 19:27:02 EDT Ventricular Rate:  82 PR Interval:  144 QRS Duration: 98 QT Interval:  358 QTC Calculation: 418 R Axis:   86 Text Interpretation: Sinus rhythm with marked sinus arrhythmia Incomplete right bundle branch block Borderline ECG Confirmed by 02-06-1971 [phone]) on 06/26/2020 10:47:38 AM  Clinical Course as of Jul 24 926  Sat Jul 24, 2020  Jul 26, 2020 Patient is agitated.  Nurse is going to give his.  PRN Geodon dose   [JK]    Clinical Course User Index [JK] 2876, MD   I have reviewed the labs performed to date as well as medications administered while in observation.  Recent changes in the last 24 hours include episodes of agitation with the patient hitting the bed and walls.  He required redirection many times. Plan  Current plan is for eventual group home placement. Patient is under full IVC at this time.   Linwood Dibbles, MD 07/24/20 07/26/20    8115, MD 07/24/20 [phone]

## 2020-07-25 DIAGNOSIS — R456 Violent behavior: Secondary | ICD-10-CM | POA: Diagnosis not present

## 2020-07-25 MED ORDER — STERILE WATER FOR INJECTION IJ SOLN
INTRAMUSCULAR | Status: AC
  Filled 2020-07-25: qty 10
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
## 2020-07-25 NOTE — ED Notes (Signed)
Verified with Mia PA and Dr. Donnald Garre ok to give another Geodon IM injection.
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
## 2020-07-26 DIAGNOSIS — R456 Violent behavior: Secondary | ICD-10-CM | POA: Diagnosis not present

## 2020-07-26 NOTE — ED Provider Notes (Signed)
Emergency Medicine Observation Re-evaluation Note  Jonathan Bird is a 29 y.o. male, seen on rounds today.  Pt initially presented to the ED for complaints of Aggressive Behavior Currently, the patient is coloring.  Cooperative and in acute distress.  Physical Exam  BP 135/83 (BP Location: Left Arm)   Pulse 95   Temp 97.9 F (36.6 C) (Oral)   Resp 20   Ht 5\' 10"  (1.778 m)   Wt 87.1 kg   SpO2 98%   BMI 27.55 kg/m  Physical Exam Vitals and nursing note reviewed.  Constitutional:      General: He is not in acute distress.    Appearance: Normal appearance. He is normal weight.  HENT:     Mouth/Throat:     Mouth: Mucous membranes are moist.  Cardiovascular:     Rate and Rhythm: Normal rate.     Pulses: Normal pulses.  Pulmonary:     Effort: Pulmonary effort is normal.  Skin:    General: Skin is warm.  Neurological:     General: No focal deficit present.     Mental Status: He is alert.  Psychiatric:     Comments: Calm and cooperative at this time     ED Course / MDM  EKG:EKG Interpretation  Date/Time:  Thursday June 24 2020 19:27:02 EDT Ventricular Rate:  82 PR Interval:  144 QRS Duration: 98 QT Interval:  358 QTC Calculation: 418 R Axis:   86 Text Interpretation: Sinus rhythm with marked sinus arrhythmia Incomplete right bundle branch block Borderline ECG Confirmed by 02-06-1971 [phone]) on 06/26/2020 10:47:38 AM  Clinical Course as of Jul 26 1044  Sat Jul 24, 2020  Jul 26, 2020 Patient is agitated.  Nurse is going to give his.  PRN Geodon dose   [JK]    Clinical Course User Index [JK] 1856, MD   I have reviewed the labs performed to date as well as medications administered while in observation.  Recent changes in the last 24 hours include no new issues. Plan  Current plan is for placement.  SW still following    Linwood Dibbles, MD 07/26/20 1048

## 2020-07-26 NOTE — ED Notes (Signed)
Pt beginning to get agitated that staff is asking him to keep the door open. He has since calmed self but staff will continue to monitor and provide therapeutic communication with him.

## 2020-07-26 NOTE — ED Notes (Signed)
Pt has been calm and cooperative with staff today.

## 2020-07-27 DIAGNOSIS — R456 Violent behavior: Secondary | ICD-10-CM | POA: Diagnosis not present

## 2020-07-27 MED ORDER — STERILE WATER FOR INJECTION IJ SOLN
INTRAMUSCULAR | Status: AC
  Filled 2020-07-27: qty 10

## 2020-07-27 NOTE — BH Assessment (Signed)
BHH Assessment Progress Note  Pt's IVC expires today.  EDP Raeford Razor, MD has initiated new IVC.  IVC documents have been faxed to [facility], and at [employer] confirms receipt.  He has since faxed Findings and Custody Order to this Clinical research associate.  At 12:13 I called [employer] and spoke to [employer], who took demographic information, agreeing to dispatch law enforcement to fill out Return of Service.  Law enforcement then presented at [facility], completing Return of Service.  Pt's nurse, Beth, and Ricquita Tarpley-Carter, CSW have been notified.  Doylene Canning, [place] Behavioral Health Coordinator [phone]

## 2020-07-27 NOTE — ED Notes (Signed)
Patient is singing / watching tv

## 2020-07-27 NOTE — ED Notes (Signed)
Patient resting on bed/ lights out

## 2020-07-27 NOTE — Social Work (Signed)
TOC CSW spoke to TCU RN/Beth.  CSW inquired about pts visitors and  It was stated that a man came to visit with pt yesterday (07/26/2020) , but they were unsure of what organization he was with.  CSW will continue to follow for dc needs.  Jong Rickman Tarpley-Carter, MSW, LCSW-A                  [name] ED Transitions of CareClinical Social Worker [email or web address] [phone]

## 2020-07-27 NOTE — ED Notes (Signed)
Patient requested another shower/ sitter advised to wait till morning.

## 2020-07-27 NOTE — ED Notes (Signed)
Pt was acting manic falling in floor, kicking doors, hitting door glass with his hand.  Writer  was not able get pt. vital signs

## 2020-07-27 NOTE — ED Notes (Signed)
Patient coloring

## 2020-07-27 NOTE — Social Work (Addendum)
TOC CSW spoke with [facility], [phone].  [name] provided CSW with a schedule of her staff will be visiting pt.  [name] will also make visits when her schedule permits. She is hoping to make a decision soon in regards to accepting pt.  CSW will continue to follow for dc needs.    Schedule is as follows:    07/28/2020     [name]  At   5pm  07/29/2020     Valerie      At  2pm  07/30/2020     Sharon     At  10am  07/30/2020     [place]      At  [employer], MSW, LCSW-A                  [name] ED Transitions of CareClinical Social Worker [email or web address] [phone]

## 2020-07-27 NOTE — ED Notes (Signed)
Patient taking a shower Gave patient towel, washcloth, new scrub top/pant, socks, bath soap, lotion

## 2020-07-27 NOTE — ED Notes (Signed)
Patient resting /lights off watching tv

## 2020-07-27 NOTE — ED Provider Notes (Signed)
Pt with some aggressive behavior around change of shift this morning but has since calmed down and showered. Eating breakfast when I saw him and was calm. Still awaiting disposition.  Vitals:   07/26/20 1802 07/26/20 2115  BP: (!) 146/114 (!) 154/95  Pulse: 100 90  Resp: 18 20  Temp: 97.6 F (36.4 C) (!) 97.4 F (36.3 C)  SpO2: 100% 100%      Raeford Razor, MD 07/27/20 [phone]

## 2020-07-27 NOTE — ED Notes (Signed)
Patient received dinner tray 

## 2020-07-27 NOTE — ED Notes (Signed)
Patient received a lunch tray.

## 2020-07-28 DIAGNOSIS — R456 Violent behavior: Secondary | ICD-10-CM | POA: Diagnosis not present

## 2020-07-28 MED ORDER — STERILE WATER FOR INJECTION IJ SOLN
INTRAMUSCULAR | Status: AC
  Filled 2020-07-28: qty 10

## 2020-07-28 NOTE — ED Notes (Signed)
Patient is agitated this am.  When breakfast came he was yelling and hitting on the bed.  He is difficult to redirect this am.

## 2020-07-28 NOTE — ED Provider Notes (Signed)
Emergency Medicine Observation Re-evaluation Note  Jonathan Bird is a 29 y.o. male, seen on rounds today.  Pt initially presented to the ED for complaints of Aggressive Behavior Currently, the patient is IVC  Physical Exam  BP 124/90 (BP Location: Left Arm)    Pulse 92    Temp 97.6 F (36.4 C) (Oral)    Resp 16    Ht 5\' 10"  (1.778 m)    Wt 87.1 kg    SpO2 98%    BMI 27.55 kg/m  Physical Exam  ED Course / MDM  EKG:EKG Interpretation  Date/Time:  Thursday June 24 2020 19:27:02 EDT Ventricular Rate:  82 PR Interval:  144 QRS Duration: 98 QT Interval:  358 QTC Calculation: 418 R Axis:   86 Text Interpretation: Sinus rhythm with marked sinus arrhythmia Incomplete right bundle branch block Borderline ECG Confirmed by 02-06-1971 [phone]) on 06/26/2020 10:47:38 AM  Clinical Course as of Jul 28 844  Sat Jul 24, 2020  Jul 26, 2020 Patient is agitated.  Nurse is going to give his.  PRN Geodon dose   [JK]    Clinical Course User Index [JK] 1448, MD   I have reviewed the labs performed to date as well as medications administered while in observation.  Recent changes in the last 24 hours include none Plan  Current plan is for placement Patient is under full IVC at this time.   Linwood Dibbles, MD 07/28/20 [phone]

## 2020-07-29 DIAGNOSIS — R456 Violent behavior: Secondary | ICD-10-CM | POA: Diagnosis not present

## 2020-07-29 NOTE — ED Notes (Signed)
Patient in shower 

## 2020-07-29 NOTE — ED Notes (Signed)
Patient sleeping/snoring

## 2020-07-29 NOTE — ED Notes (Signed)
Patient sleeping / Rise and fall of chest breathing observed 

## 2020-07-29 NOTE — ED Notes (Signed)
Patient sleeping/rise and fall of chest breathing observed 

## 2020-07-29 NOTE — ED Notes (Addendum)
Patient taking shower.

## 2020-07-29 NOTE — ED Notes (Signed)
Patient awake/ Breakfast tray delivered  

## 2020-07-29 NOTE — ED Notes (Signed)
Patient still sleeping/ Rise and fall of chest cavity observed

## 2020-07-29 NOTE — Social Work (Addendum)
TOC CSW reached out to [employer], [phone] to confirm if a visit happened yesterday with [name], and if [name] would still be visiting today .  CSW left HIPPA compliant message with my contact information.  CSW will continue to follow for dc needs.  Jonathan Bird, MSW, LCSW-A                  [name] ED Transitions of CareClinical Social Worker [email or web address] [phone]

## 2020-07-29 NOTE — ED Notes (Signed)
Patient awake

## 2020-07-29 NOTE — ED Notes (Signed)
Patient finished shower 

## 2020-07-29 NOTE — ED Notes (Addendum)
Patient coloring/calm

## 2020-07-29 NOTE — ED Notes (Signed)
Patient sleeping/ rise and fall of chest observed

## 2020-07-29 NOTE — ED Notes (Signed)
Patient's lunch tray delivered.

## 2020-07-30 DIAGNOSIS — R456 Violent behavior: Secondary | ICD-10-CM | POA: Diagnosis not present

## 2020-07-30 MED ORDER — STERILE WATER FOR INJECTION IJ SOLN
INTRAMUSCULAR | Status: AC
  Filled 2020-07-30: qty 10

## 2020-07-30 NOTE — ED Notes (Signed)
Pt did not have an visit for assessment

## 2020-07-30 NOTE — ED Notes (Signed)
Pt has been alert, cooperative this shift. Pt medication compliant this shift.

## 2020-07-30 NOTE — ED Notes (Signed)
Pt alert to self up listen to music, appears to be in a pleasant mood. Can answer yes and no questions and follows basic commands. I will continue to monitor.

## 2020-07-30 NOTE — ED Notes (Signed)
Uneventful shift for patient, took some additional time to get patient to calm down for bed, slept through most of night afterwards.

## 2020-07-31 DIAGNOSIS — R456 Violent behavior: Secondary | ICD-10-CM | POA: Diagnosis not present

## 2020-07-31 NOTE — ED Provider Notes (Signed)
Emergency Medicine Observation Re-evaluation Note  Jonathan Bird is a 29 y.o. male, seen on rounds today.  Pt initially presented to the ED for complaints of Aggressive Behavior Currently, the patient is resting comfortable Vitals reviewed form past 24 hours, stable overnight.  Physical Exam  BP (!) 140/93 (BP Location: Left Arm)    Pulse 91    Temp 98.1 F (36.7 C) (Oral)    Resp 20    Ht 5\' 10"  (1.778 m)    Wt 87.1 kg    SpO2 98%    BMI 27.55 kg/m  Physical Exam General: No acute distress Cardiac: Regular rate and rhythm Lungs: No respiratory distress, RR wnl Psych: Calm on exam  ED Course / MDM  EKG:EKG Interpretation  Date/Time:  Thursday June 24 2020 19:27:02 EDT Ventricular Rate:  82 PR Interval:  144 QRS Duration: 98 QT Interval:  358 QTC Calculation: 418 R Axis:   86 Text Interpretation: Sinus rhythm with marked sinus arrhythmia Incomplete right bundle branch block Borderline ECG Confirmed by 02-06-1971 [phone]) on 06/26/2020 10:47:38 AM  Clinical Course as of Jul 31 941  Sat Jul 24, 2020  Jul 26, 2020 Patient is agitated.  Nurse is going to give his.  PRN Geodon dose   [JK]    Clinical Course User Index [JK] 0355, MD   I have reviewed the labs performed to date as well as medications administered while in observation.  Recent changes in the last 24 hours include - no significant changes  Plan  Current plan is for placement Patient is under full IVC at this time.   Jonathan Dibbles, MD 07/31/20 [phone]

## 2020-08-01 DIAGNOSIS — R456 Violent behavior: Secondary | ICD-10-CM | POA: Diagnosis not present

## 2020-08-01 NOTE — ED Notes (Signed)
Pt currently taking a shower. Bed sheets changed, new burgundy scrubs given. Will continue to monitor pt

## 2020-08-01 NOTE — ED Notes (Signed)
Pt up today, took shower, listening to music.

## 2020-08-01 NOTE — ED Notes (Signed)
Pt.s lunch has arrived, pt listening to music and sitting up eatings his food. Will continue to monitor pt

## 2020-08-01 NOTE — ED Notes (Signed)
Pt taking another shower saying "stink, stink". Indicating that the patient felt dirty. Provided everything for patient to take a shower. Will continue to monitor pt

## 2020-08-01 NOTE — ED Notes (Signed)
Pt.s breakfast has arrived, set outside on nurses station. Will be given to him once he wakes up. Will continue to monitor pt.

## 2020-08-01 NOTE — ED Notes (Signed)
Pt listening to music and clapping to the beat to the music. Pt displaying enjoyment upon music. Will continue to monitor pt

## 2020-08-01 NOTE — ED Notes (Signed)
Pt.s dinner arrived. Pt sitting up and eating his dinner. Will continue to monitor pt.

## 2020-08-01 NOTE — ED Notes (Signed)
Pt listening to music, as allowed upon the floor by RN. Pt clapping and singing. Will continue to monitor pt.

## 2020-08-01 NOTE — ED Notes (Signed)
Pt is awake, food heated up and given to him. Pt sitting up and eating his breakfast. Will continue to monitor pt.

## 2020-08-01 NOTE — ED Notes (Addendum)
Pt.s breakfast has been ordered. As well as Lunch and dinner. Will continue to monitor pt

## 2020-08-02 DIAGNOSIS — R456 Violent behavior: Secondary | ICD-10-CM | POA: Diagnosis not present

## 2020-08-02 LAB — SARS CORONAVIRUS 2 BY RT PCR (HOSPITAL ORDER, PERFORMED IN ~~LOC~~ HOSPITAL LAB): SARS Coronavirus 2: NEGATIVE

## 2020-08-02 NOTE — ED Provider Notes (Signed)
Emergency Medicine Observation Re-evaluation Note  Jonathan Bird is a 29 y.o. male, seen on rounds today.  Pt initially presented to the ED for complaints of Aggressive Behavior Currently, the patient is awaiting placement for aggressive behavior, hx/o autism.  He is under IVC.  Pt without acute complaints.  On eval pt singing Happy [place] Ouch - his birthday is tomorrow.    Physical Exam  BP (!) 140/104 (BP Location: Left Arm)   Pulse 98   Temp 97.7 F (36.5 C) (Oral)   Resp 16   Ht 5\' 10"  (1.778 m)   Wt 87.1 kg   SpO2 98%   BMI 27.55 kg/m  Physical Exam General: NAD Cardiac: RRR Lungs: no respiratory distress Psych: in good spirits.  Walking the room, clapping and singing.    ED Course / MDM  EKG:EKG Interpretation  Date/Time:  Thursday June 24 2020 19:27:02 EDT Ventricular Rate:  82 PR Interval:  144 QRS Duration: 98 QT Interval:  358 QTC Calculation: 418 R Axis:   86 Text Interpretation: Sinus rhythm with marked sinus arrhythmia Incomplete right bundle branch block Borderline ECG Confirmed by 02-06-1971 [phone]) on 06/26/2020 10:47:38 AM  Clinical Course as of Aug 02 1145  Sat Jul 24, 2020  Jul 26, 2020 Patient is agitated.  Nurse is going to give his.  PRN Geodon dose   [JK]    Clinical Course User Index [JK] 5102, MD   I have reviewed the labs performed to date as well as medications administered while in observation. No significant changes in last 24 hours.    Plan  Current plan is for placement pending Patient is under full IVC at this time.   Linwood Dibbles, MD 08/02/20 1147

## 2020-08-02 NOTE — ED Notes (Signed)
Pt has been resting and sleeping this shift. Pt has be cooperative. Pt at breakfast.

## 2020-08-02 NOTE — Social Work (Signed)
.   Transition of Care [facility]) - Emergency Department Mini Assessment   Patient Details  Name: Jonathan Bird MRN: [id number] Date of Birth: 09-29-1991  [facility]) CM/SW Contact:    [name], LCSWA Phone Number: 08/02/2020, 1:07 PM   Clinical Narrative: Patient has received placement to [facility] on 08/09/2020.  DO NOT DISCLOSE to patient that he has placement until the day of discharge.  Patient will need to be IVC'd until he leaves for placement.   ED Mini Assessment:    Barriers to Discharge: Barriers Resolved  Barrier interventions: Pt was in need of Group Home/Hospital placement.  Care Coordination Team has found pt placement at [facility].  Means of departure: Car  Interventions which prevented an admission or readmission: Other (must enter comment) (No Group Home/Hospital  Placement)    Patient Contact and Communications Key Contact 1: [name] [phone] Key Contact 2: [name] [phone] Spoke with: [name] Contact Date: 07/30/20,   Contact time: 1018 Contact Phone Number: [phone] Call outcome: Pt has placement.  Patient states their goals for this hospitalization and ongoing recovery are:: Pt has placement.  DO NOT DISCLOSE to pt that he has recieved placement until the day he is to leave on 08/09/3020.   Choice offered to / list presented to : NA  Admission diagnosis:  IVC Patient Active Problem List   Diagnosis Date Noted  . 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 Intellectual disability 02/04/2020  . Outbursts of anger   . Autism disorder 01/22/2020  . Aggressive behavior 01/22/2020   PCP:  [facility] Pharmacy:   [id number] [place], [place] - [phone] AT [facility] OF [facility] & [address] [place] [address] Phone: [phone] Fax: [phone]

## 2020-08-03 DIAGNOSIS — R456 Violent behavior: Secondary | ICD-10-CM | POA: Diagnosis not present

## 2020-08-03 NOTE — ED Notes (Signed)
Pt had a great Birthday today. Staff threw him a party and 2 relatives visited with gifts. He was very appropriate and cooperative.

## 2020-08-03 NOTE — ED Notes (Signed)
Patient sleeping/rise and fall of chest breathing observed 

## 2020-08-03 NOTE — ED Notes (Signed)
Patient received dinner tray 

## 2020-08-03 NOTE — ED Provider Notes (Signed)
  Physical Exam  BP 132/85 (BP Location: Left Arm)   Pulse 92   Temp 97.7 F (36.5 C) (Oral)   Resp 16   Ht 5\' 10"  (1.778 m)   Wt 87.1 kg   SpO2 98%   BMI 27.55 kg/m   Physical Exam  ED Course/Procedures   Clinical Course as of Aug 03 856  Sat Jul 24, 2020  Jul 26, 2020 Patient is agitated.  Nurse is going to give his.  PRN Geodon dose   [JK]    Clinical Course User Index [JK] 6811, MD    Procedures  MDM  Reevaluated patient in the emergency department.  He has been present in the emergency department for approximately 8 weeks now.  He has had continued labile behavior, with aggressive outbursts.  This morning, he is calm and appropriate.  He is sleeping when I entered the room and calm upon waking.  Today is his birthday.  No other acute medical concerns.  Continue to await placement.  I have completed his IVC to continue to hold him in the emergency department until we find a safe disposition.   Linwood Dibbles, MD 08/03/20 [phone]

## 2020-08-03 NOTE — ED Notes (Signed)
Patient taking shower.

## 2020-08-03 NOTE — ED Notes (Signed)
Patients aunt visiting

## 2020-08-03 NOTE — ED Notes (Signed)
Patient received lunch tray 

## 2020-08-03 NOTE — ED Notes (Addendum)
Patient awake Patient received breakfast tray 

## 2020-08-03 NOTE — ED Notes (Addendum)
Patient sleeping/rise and fall of chest breathing observed 

## 2020-08-03 NOTE — ED Notes (Addendum)
Patient listening to music

## 2020-08-04 DIAGNOSIS — R456 Violent behavior: Secondary | ICD-10-CM | POA: Diagnosis not present

## 2020-08-04 NOTE — ED Provider Notes (Signed)
Patient to be placed on 8/30. IVC should be maintained so that patient can be placed there.   Virgina Norfolk, DO 08/04/20 [phone]

## 2020-08-05 DIAGNOSIS — R456 Violent behavior: Secondary | ICD-10-CM | POA: Diagnosis not present

## 2020-08-05 NOTE — ED Notes (Signed)
Patient sleeping/rise and fall of chest breathing observed 

## 2020-08-05 NOTE — ED Notes (Signed)
Patient awake breakfast tray delivered  

## 2020-08-06 DIAGNOSIS — R456 Violent behavior: Secondary | ICD-10-CM | POA: Diagnosis not present

## 2020-08-06 NOTE — ED Notes (Signed)
PT WAS GIVEN BREAKFAST TRAY.

## 2020-08-06 NOTE — ED Notes (Signed)
DINNER TRAY WAS GIVEN

## 2020-08-06 NOTE — ED Provider Notes (Signed)
Patient is still awaiting placement potentially next week per social work.  Patient has no complaints and is happily coloring.  He had no physical complaints today and his lungs were clear on my exam.  Patient ate without any difficulty today.  Patient awaiting placement.   Zelina Jimerson, Canary Brim, MD 08/06/20 1329

## 2020-08-06 NOTE — ED Notes (Signed)
PT WANTED TO TAKE SHOWER AND BED WAS REMADE

## 2020-08-07 DIAGNOSIS — R456 Violent behavior: Secondary | ICD-10-CM | POA: Diagnosis not present

## 2020-08-07 NOTE — ED Notes (Signed)
Calm and cooperative during shift.

## 2020-08-07 NOTE — ED Notes (Signed)
Pt alert this shift. Pt cooperative, redirectable. Pt medication compliant. No s/s of distress.

## 2020-08-07 NOTE — Progress Notes (Signed)
Patient will go to to [facility] in [place], [place] on Monday 08/09/20. The facility will require the patient's current MAR be sent prior to his discharge to the facility. The facility will also require the patient arrive with hard prescriptions for all of his medications.  This patient is to remain IVC'ed until the time of discharge. Please do not notify the patient of discharge until Monday morning, per the request of his guardian.  CSW will continue following for discharge planning.  Edwin Dada, MSW, LCSW-A Transitions of Care  Clinical Social Worker  [facility] Emergency Departments  Medical ICU [phone]

## 2020-08-08 DIAGNOSIS — R456 Violent behavior: Secondary | ICD-10-CM | POA: Diagnosis not present

## 2020-08-08 LAB — SARS CORONAVIRUS 2 BY RT PCR (HOSPITAL ORDER, PERFORMED IN ~~LOC~~ HOSPITAL LAB): SARS Coronavirus 2: NEGATIVE

## 2020-08-08 MED ORDER — HYDROXYZINE HCL 25 MG PO TABS
50.0000 mg | ORAL_TABLET | Freq: Once | ORAL | Status: AC
  Administered 2020-08-08: 50 mg via ORAL
  Filled 2020-08-08: qty 2

## 2020-08-08 NOTE — ED Notes (Signed)
Patient resting quietly in room.  

## 2020-08-08 NOTE — Progress Notes (Signed)
CSW received email from staff at [facility] stating this patient needs an updated COVID test prior to admission. CSW spoke with Kendal Hymen, RN to request a COVID test be competed on this patient.  Edwin Dada, MSW, LCSW-A Transitions of Care  Clinical Social Worker  [facility] Emergency Departments  Medical ICU [phone]

## 2020-08-08 NOTE — ED Notes (Signed)
Fire drill patients door closed  

## 2020-08-08 NOTE — ED Notes (Signed)
Pt rested throughout shift with intermittent episodes of waking and speaking with sitter. Pt was cooperative and able to be redirected without issue.

## 2020-08-08 NOTE — ED Notes (Signed)
Patient received dinner tray 

## 2020-08-08 NOTE — ED Notes (Signed)
Patient taking shower.

## 2020-08-08 NOTE — ED Provider Notes (Addendum)
Emergency Medicine Observation Re-evaluation Note  Jonathan Bird is a 29 y.o. male, seen on rounds today.  Pt initially presented to the ED for complaints of Aggressive Behavior Currently, the patient is stable under ivc.  Physical Exam  BP (!) 140/95 (BP Location: Left Arm)   Pulse 91   Temp 97.7 F (36.5 C) (Oral)   Resp 18   Ht 1.778 m (5\' 10" )   Wt 87.1 kg   SpO2 98%   BMI 27.55 kg/m  Physical Exam General: resting Cardiac: rrr Lungs: cta Psych: less agitated and resting  ED Course / MDM  EKG:EKG Interpretation  Date/Time:  Thursday June 24 2020 19:27:02 EDT Ventricular Rate:  82 PR Interval:  144 QRS Duration: 98 QT Interval:  358 QTC Calculation: 418 R Axis:   86 Text Interpretation: Sinus rhythm with marked sinus arrhythmia Incomplete right bundle branch block Borderline ECG Confirmed by 1971 [phone]) on 06/26/2020 10:47:38 AM  Clinical Course as of Aug 08 1312  Sat Jul 24, 2020  Jul 26, 2020 Patient is agitated.  Nurse is going to give his.  PRN Geodon dose   [JK]    Clinical Course User Index [JK] 2979, MD   I have reviewed the labs performed to date as well as medications administered while in observation.  Recent changes in the last 24 hours include [facility] requests covd test.  Plan  Current plan is for placement.  Covid test ordered  Patient is under full IVC at this time. Patient is reported to be accepted to facility pending repeat covid Vstaril ordered to assist with calming prior to covid swab   Linwood Dibbles, MD 08/08/20 1316    08/10/20, MD 08/08/20 1336

## 2020-08-08 NOTE — ED Notes (Signed)
Patient stated he had a headache

## 2020-08-08 NOTE — ED Notes (Signed)
Patient redirected from hitting the wall and asked to "calm-down"

## 2020-08-08 NOTE — ED Notes (Signed)
Patient asleep rise and fall of chest breathing observed 

## 2020-08-08 NOTE — ED Notes (Signed)
Patient resting.

## 2020-08-09 DIAGNOSIS — R456 Violent behavior: Secondary | ICD-10-CM | POA: Diagnosis not present

## 2020-08-09 MED ORDER — HYDROXYZINE HCL 25 MG PO TABS: 25.0000 mg | ORAL_TABLET | Freq: Three times a day (TID) | ORAL | 0 refills | Status: AC

## 2020-08-09 MED ORDER — LORAZEPAM 1 MG PO TABS: 1.0000 mg | ORAL_TABLET | Freq: Three times a day (TID) | ORAL | 0 refills | Status: AC | PRN

## 2020-08-09 MED ORDER — ZIPRASIDONE MESYLATE 20 MG IM SOLR
10.0000 mg | Freq: Once | INTRAMUSCULAR | Status: AC
  Administered 2020-08-09: 10 mg via INTRAMUSCULAR
  Filled 2020-08-09: qty 20

## 2020-08-09 MED ORDER — CHLORPROMAZINE HCL 200 MG PO TABS: 200.0000 mg | ORAL_TABLET | Freq: Two times a day (BID) | ORAL | 0 refills | Status: AC

## 2020-08-09 MED ORDER — CARBAMAZEPINE 200 MG PO TABS: 200.0000 mg | ORAL_TABLET | Freq: Two times a day (BID) | ORAL | 0 refills | Status: AC

## 2020-08-09 MED ORDER — AMANTADINE HCL 100 MG PO CAPS: 100.0000 mg | ORAL_CAPSULE | Freq: Two times a day (BID) | ORAL | 0 refills | Status: AC

## 2020-08-09 MED ORDER — OLANZAPINE 5 MG PO TABS: 5.0000 mg | ORAL_TABLET | Freq: Two times a day (BID) | ORAL | 0 refills | Status: AC

## 2020-08-09 MED ORDER — STERILE WATER FOR INJECTION IJ SOLN
INTRAMUSCULAR | Status: AC
  Filled 2020-08-09: qty 10

## 2020-08-09 MED ORDER — CLONAZEPAM 0.5 MG PO TABS: 0.5000 mg | ORAL_TABLET | Freq: Every day | ORAL | 0 refills | Status: AC

## 2020-08-09 MED ORDER — MIRTAZAPINE 15 MG PO TABS: 15.0000 mg | ORAL_TABLET | Freq: Every day | ORAL | 0 refills | Status: AC

## 2020-08-09 MED FILL — LORazepam 1 MG TABS: 1 | 7 days supply | Qty: 21 | Fill #0

## 2020-08-09 MED FILL — OLANZapine 5 MG TABS: 5 | 7 days supply | Qty: 14 | Fill #0

## 2020-08-09 MED FILL — clonazePAM 0.5 MG TABS: 0.5 | 7 days supply | Qty: 7 | Fill #0

## 2020-08-09 MED FILL — Olanzapine Tab 5 MG: ORAL | Qty: 5 | Status: AC

## 2020-08-09 MED FILL — Chlorpromazine HCl Tab 25 MG: ORAL | Qty: 200 | Status: AC

## 2020-08-09 MED FILL — Clonazepam Tab 0.5 MG: ORAL | Qty: 0.5 | Status: AC

## 2020-08-09 MED FILL — Amantadine HCl Cap 100 MG: ORAL | Qty: 100 | Status: AC

## 2020-08-09 MED FILL — Hydroxyzine HCl Tab 25 MG: ORAL | Qty: 50 | Status: AC

## 2020-08-09 MED FILL — Acetaminophen Tab 325 MG: ORAL | Qty: 650 | Status: AC

## 2020-08-09 NOTE — ED Notes (Signed)
Pt in shower at this time yelling. Pt instructed not to yell and security at bedside for assistance. Pt was provided with towels and new scrubs.

## 2020-08-09 NOTE — ED Notes (Signed)
Pt out of shower and changed into new scrubs along with socks and underwear. Pt now calm and cooperative. Pt continues to ask for a shave and haircut.

## 2020-08-09 NOTE — ED Notes (Signed)
Pt reporting headache at this time. Tylenol to be given.

## 2020-08-09 NOTE — Discharge Instructions (Addendum)
Followup with your doctor as needed

## 2020-08-09 NOTE — ED Notes (Signed)
Pt DC off unit with caregivers to facility per provider. Pt alert, cooperative, no s/s of distress. DC information given to caregiver with prescriptions and medication. Belongings given to pt and caregivers. Pt ambulatory off unit, escorted by NT, SW and caregivers. Pt transported by caregivers.
